# Patient Record
Sex: Female | Born: 1960 | Race: Black or African American | Hispanic: No | Smoking: Never smoker
Health system: Southern US, Community
[De-identification: ages and names within clinical notes are randomized; demographics above are authoritative.]

## PROBLEM LIST (undated history)

## (undated) DIAGNOSIS — E1142 Type 2 diabetes mellitus with diabetic polyneuropathy: Secondary | ICD-10-CM

## (undated) DIAGNOSIS — I4819 Other persistent atrial fibrillation: Secondary | ICD-10-CM

## (undated) DIAGNOSIS — F419 Anxiety disorder, unspecified: Secondary | ICD-10-CM

## (undated) DIAGNOSIS — M5416 Radiculopathy, lumbar region: Secondary | ICD-10-CM

## (undated) DIAGNOSIS — L729 Follicular cyst of the skin and subcutaneous tissue, unspecified: Secondary | ICD-10-CM

## (undated) DIAGNOSIS — I2510 Atherosclerotic heart disease of native coronary artery without angina pectoris: Secondary | ICD-10-CM

## (undated) DIAGNOSIS — G4733 Obstructive sleep apnea (adult) (pediatric): Secondary | ICD-10-CM

## (undated) DIAGNOSIS — F329 Major depressive disorder, single episode, unspecified: Secondary | ICD-10-CM

## (undated) DIAGNOSIS — I119 Hypertensive heart disease without heart failure: Secondary | ICD-10-CM

## (undated) DIAGNOSIS — Z789 Other specified health status: Secondary | ICD-10-CM

## (undated) DIAGNOSIS — E7800 Pure hypercholesterolemia, unspecified: Secondary | ICD-10-CM

## (undated) DIAGNOSIS — G5601 Carpal tunnel syndrome, right upper limb: Secondary | ICD-10-CM

## (undated) DIAGNOSIS — E669 Obesity, unspecified: Secondary | ICD-10-CM

## (undated) DIAGNOSIS — H3320 Serous retinal detachment, unspecified eye: Secondary | ICD-10-CM

## (undated) DIAGNOSIS — G4700 Insomnia, unspecified: Secondary | ICD-10-CM

## (undated) HISTORY — DX: Pure hypercholesterolemia, unspecified: E78.00

## (undated) HISTORY — DX: Carpal tunnel syndrome, right upper limb: G56.01

## (undated) HISTORY — PX: OTHER SURGICAL HISTORY: SHX169

## (undated) HISTORY — DX: Radiculopathy, lumbar region: M54.16

## (undated) HISTORY — DX: Follicular cyst of the skin and subcutaneous tissue, unspecified: L72.9

## (undated) HISTORY — DX: Atherosclerotic heart disease of native coronary artery without angina pectoris: I25.10

## (undated) HISTORY — DX: Obstructive sleep apnea (adult) (pediatric): G47.33

## (undated) HISTORY — DX: Other persistent atrial fibrillation: I48.19

## (undated) HISTORY — DX: Serous retinal detachment, unspecified eye: H33.20

## (undated) HISTORY — DX: Insomnia, unspecified: G47.00

## (undated) HISTORY — DX: Anxiety disorder, unspecified: F41.9

## (undated) HISTORY — DX: Other specified health status: Z78.9

---

## 2014-07-27 ENCOUNTER — Inpatient Hospital Stay (HOSPITAL_COMMUNITY)
Admission: AD | Admit: 2014-07-27 | Discharge: 2014-07-30 | DRG: 287 | Disposition: A | Discharge status: Home / Self Care | Payer: Medicaid Other | Source: Other Acute Inpatient Hospital | Attending: Cardiology | Admitting: Cardiology

## 2014-07-27 ENCOUNTER — Encounter (HOSPITAL_COMMUNITY): Payer: Self-pay | Admitting: Cardiology

## 2014-07-27 DIAGNOSIS — F32A Depression, unspecified: Secondary | ICD-10-CM

## 2014-07-27 DIAGNOSIS — F329 Major depressive disorder, single episode, unspecified: Secondary | ICD-10-CM | POA: Diagnosis present

## 2014-07-27 DIAGNOSIS — Z888 Allergy status to other drugs, medicaments and biological substances status: Secondary | ICD-10-CM

## 2014-07-27 DIAGNOSIS — E1169 Type 2 diabetes mellitus with other specified complication: Secondary | ICD-10-CM | POA: Diagnosis present

## 2014-07-27 DIAGNOSIS — R778 Other specified abnormalities of plasma proteins: Secondary | ICD-10-CM | POA: Diagnosis present

## 2014-07-27 DIAGNOSIS — E669 Obesity, unspecified: Secondary | ICD-10-CM | POA: Diagnosis present

## 2014-07-27 DIAGNOSIS — Z809 Family history of malignant neoplasm, unspecified: Secondary | ICD-10-CM

## 2014-07-27 DIAGNOSIS — Z8249 Family history of ischemic heart disease and other diseases of the circulatory system: Secondary | ICD-10-CM

## 2014-07-27 DIAGNOSIS — Z6837 Body mass index (BMI) 37.0-37.9, adult: Secondary | ICD-10-CM

## 2014-07-27 DIAGNOSIS — Z9119 Patient's noncompliance with other medical treatment and regimen: Secondary | ICD-10-CM | POA: Diagnosis present

## 2014-07-27 DIAGNOSIS — E785 Hyperlipidemia, unspecified: Secondary | ICD-10-CM | POA: Diagnosis present

## 2014-07-27 DIAGNOSIS — I2510 Atherosclerotic heart disease of native coronary artery without angina pectoris: Secondary | ICD-10-CM | POA: Diagnosis present

## 2014-07-27 DIAGNOSIS — I119 Hypertensive heart disease without heart failure: Secondary | ICD-10-CM | POA: Diagnosis present

## 2014-07-27 DIAGNOSIS — R7989 Other specified abnormal findings of blood chemistry: Secondary | ICD-10-CM

## 2014-07-27 DIAGNOSIS — R079 Chest pain, unspecified: Secondary | ICD-10-CM | POA: Diagnosis present

## 2014-07-27 DIAGNOSIS — E1142 Type 2 diabetes mellitus with diabetic polyneuropathy: Secondary | ICD-10-CM

## 2014-07-27 DIAGNOSIS — Z880 Allergy status to penicillin: Secondary | ICD-10-CM | POA: Diagnosis not present

## 2014-07-27 DIAGNOSIS — I10 Essential (primary) hypertension: Secondary | ICD-10-CM | POA: Diagnosis present

## 2014-07-27 DIAGNOSIS — Z823 Family history of stroke: Secondary | ICD-10-CM

## 2014-07-27 HISTORY — DX: Type 2 diabetes mellitus with diabetic polyneuropathy: E11.42

## 2014-07-27 HISTORY — DX: Hypertensive heart disease without heart failure: I11.9

## 2014-07-27 HISTORY — DX: Depression, unspecified: F32.A

## 2014-07-27 HISTORY — DX: Obesity, unspecified: E66.9

## 2014-07-27 HISTORY — DX: Major depressive disorder, single episode, unspecified: F32.9

## 2014-07-27 LAB — MRSA PCR SCREENING: MRSA BY PCR: NEGATIVE

## 2014-07-27 LAB — TROPONIN I
TROPONIN I: 0.05 ng/mL — AB (ref <0.031)
Troponin I: 0.03 ng/mL (ref <0.031)

## 2014-07-27 LAB — GLUCOSE, CAPILLARY
GLUCOSE-CAPILLARY: 303 mg/dL — AB (ref 70–99)
Glucose-Capillary: 368 mg/dL — ABNORMAL HIGH (ref 70–99)

## 2014-07-27 LAB — HEMOGLOBIN A1C
Hgb A1c MFr Bld: 9.6 % — ABNORMAL HIGH (ref <5.7)
MEAN PLASMA GLUCOSE: 229 mg/dL — AB (ref <117)

## 2014-07-27 LAB — TSH: TSH: 0.997 u[IU]/mL (ref 0.350–4.500)

## 2014-07-27 LAB — MAGNESIUM: MAGNESIUM: 1.8 mg/dL (ref 1.5–2.5)

## 2014-07-27 LAB — T4, FREE: Free T4: 0.89 ng/dL (ref 0.80–1.80)

## 2014-07-27 MED ORDER — ENOXAPARIN SODIUM 60 MG/0.6ML ~~LOC~~ SOLN
50.0000 mg | SUBCUTANEOUS | Status: DC
  Administered 2014-07-28: 50 mg via SUBCUTANEOUS
  Filled 2014-07-27 (×3): qty 0.6

## 2014-07-27 MED ORDER — HYDROCODONE-ACETAMINOPHEN 5-325 MG PO TABS
1.0000 | ORAL_TABLET | Freq: Four times a day (QID) | ORAL | Status: DC | PRN
  Administered 2014-07-28 – 2014-07-29 (×3): 1 via ORAL
  Filled 2014-07-27 (×3): qty 1

## 2014-07-27 MED ORDER — AMLODIPINE BESYLATE 10 MG PO TABS
10.0000 mg | ORAL_TABLET | Freq: Every day | ORAL | Status: DC
  Administered 2014-07-27 – 2014-07-30 (×4): 10 mg via ORAL
  Filled 2014-07-27 (×4): qty 1

## 2014-07-27 MED ORDER — ZOLPIDEM TARTRATE 5 MG PO TABS: 5.0000 mg | ORAL_TABLET | Freq: Every evening | ORAL | Status: DC | PRN

## 2014-07-27 MED ORDER — NITROGLYCERIN 0.4 MG SL SUBL: 0.4000 mg | SUBLINGUAL_TABLET | SUBLINGUAL | Status: DC | PRN

## 2014-07-27 MED ORDER — LABETALOL HCL 300 MG PO TABS
300.0000 mg | ORAL_TABLET | Freq: Two times a day (BID) | ORAL | Status: DC
  Administered 2014-07-27 – 2014-07-30 (×7): 300 mg via ORAL
  Filled 2014-07-27 (×9): qty 1

## 2014-07-27 MED ORDER — INSULIN ASPART 100 UNIT/ML ~~LOC~~ SOLN
0.0000 [IU] | Freq: Three times a day (TID) | SUBCUTANEOUS | Status: DC
  Administered 2014-07-27: 11 [IU] via SUBCUTANEOUS
  Administered 2014-07-28: 8 [IU] via SUBCUTANEOUS
  Administered 2014-07-28: 11 [IU] via SUBCUTANEOUS
  Administered 2014-07-28 – 2014-07-30 (×3): 3 [IU] via SUBCUTANEOUS
  Administered 2014-07-30: 8 [IU] via SUBCUTANEOUS

## 2014-07-27 MED ORDER — SODIUM CHLORIDE 0.9 % IV SOLN: INTRAVENOUS | Status: DC

## 2014-07-27 MED ORDER — NITROGLYCERIN IN D5W 200-5 MCG/ML-% IV SOLN: 5.0000 ug/min | INTRAVENOUS | Status: DC

## 2014-07-27 MED ORDER — ONDANSETRON HCL 4 MG/2ML IJ SOLN: 4.0000 mg | Freq: Four times a day (QID) | INTRAMUSCULAR | Status: DC | PRN

## 2014-07-27 MED ORDER — ROSUVASTATIN CALCIUM 5 MG PO TABS
5.0000 mg | ORAL_TABLET | Freq: Every day | ORAL | Status: DC
  Administered 2014-07-28 – 2014-07-29 (×2): 5 mg via ORAL
  Filled 2014-07-27 (×5): qty 1

## 2014-07-27 MED ORDER — LORAZEPAM 1 MG PO TABS: 1.0000 mg | ORAL_TABLET | Freq: Two times a day (BID) | ORAL | Status: DC | PRN

## 2014-07-27 MED ORDER — INSULIN ASPART 100 UNIT/ML ~~LOC~~ SOLN
0.0000 [IU] | Freq: Every day | SUBCUTANEOUS | Status: DC
  Administered 2014-07-27: 5 [IU] via SUBCUTANEOUS
  Administered 2014-07-28: 3 [IU] via SUBCUTANEOUS

## 2014-07-27 MED ORDER — INFLUENZA VAC SPLIT QUAD 0.5 ML IM SUSY
0.5000 mL | PREFILLED_SYRINGE | INTRAMUSCULAR | Status: AC
  Administered 2014-07-30: 0.5 mL via INTRAMUSCULAR
  Filled 2014-07-27 (×2): qty 0.5

## 2014-07-27 MED ORDER — ACETAMINOPHEN 325 MG PO TABS: 650.0000 mg | ORAL_TABLET | ORAL | Status: DC | PRN

## 2014-07-27 MED ORDER — HYDRALAZINE HCL 25 MG PO TABS
25.0000 mg | ORAL_TABLET | Freq: Three times a day (TID) | ORAL | Status: DC
  Administered 2014-07-27 – 2014-07-29 (×8): 25 mg via ORAL
  Filled 2014-07-27 (×13): qty 1

## 2014-07-27 MED ORDER — ASPIRIN EC 81 MG PO TBEC
81.0000 mg | DELAYED_RELEASE_TABLET | Freq: Every day | ORAL | Status: DC
  Administered 2014-07-28: 81 mg via ORAL
  Filled 2014-07-27 (×2): qty 1

## 2014-07-27 NOTE — H&P (Signed)
Andrea Grant is an 53 y.o. female.    Primary Cardiologist: NEW PCP: Neale Burly, MD  Chief Complaint: chest pain  HPI: 53 year old female with hx of HTN and diabetes who presented to [place] with 1 week hx of lt scapula pain that became intense and radiated to her chart and into her Lt axila and Lt arm.  She thought that it is like a pulled muscle.  No associated SOB or nausea some diaphoresis.  When the pain began 1 week ago she thought it was muscle spasm and used heat and ICE though not much relief.  Occ aleve which helped some.  She had a nose bleed and realized her BP was elevated.  She has missed 3 days now of BP meds- she had run out.  Today with radiation she became worried it was her heart.  Once she rec'd IV NTG and IV morphine her pain is now gone.  BP on arrival to [facility].  212/116.  Troponin elevated at 0.06 with neg CKMB. EKG with no old one to compare SR non specific T wave abnormality.   LABS: protime 11.7 INR 0.8 wbc 12.1  hgb 13.4/hct 41.6, plt 284, glu 282, BUN 15 Cr 0.90 GFR > 60 Na 132. K+ 3.7 CPK 79, MB 1.9 Troponin 0.06   CXR no acute cardiopulmonary process CT of chest: no acute vascular process, no aortic dissection or acute cardiopulmonary process.        Past Medical History  Diagnosis Date  . HTN (hypertension) 07/27/2014  . DM2 (diabetes mellitus, type 2) 07/27/2014  . Depression, episodic 07/27/2014   Past Surgical History  Procedure Laterality Date  . No surgical hx      Family History  Problem Relation Age of Onset  . Cancer Mother   . Heart disease Mother   . CVA Father    Social History:  reports that she has never smoked. She has never used smokeless tobacco. She reports that she does not drink alcohol or use illicit drugs. 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   Allergies:  Allergies  Allergen Reactions  . Penicillins   .  Percocet [Oxycodone-Acetaminophen]     OUTPT MEDS: norvasc 10 mg daily Ativan 1 mg prn Labetolol 300 mg BID Hydralazine 25 mg TID glucovance 5/500 BID crestor 5 mg daily- has not been taking Norco 5.325 prn neck pain  Humolog 75/25 20 units [place] BID BK and dinner  ROS: General:no colds or fevers, no weight changes Skin:no rashes or ulcers HEENT:no blurred vision, no congestion, recent nose bleeds CV:see HPI PUL:see HPI GI:no diarrhea constipation or melena, no indigestion GU:no hematuria, no dysuria MS:no joint pain, no claudication Neuro:no syncope, no lightheadedness Endo:+ diabetes fairly well controlled, no thyroid disease GYN: LMP 1 month ago, denies preg- her MD stated she could not get pregnant.   Blood pressure 162/90, pulse 79, temperature 98.3 F (36.8 C), temperature source Oral, height 5\' 5"  (1.651 m), weight 225 lb 1.4 oz (102.1 kg), SpO2 100 %. PE: General: Obese pleasant black female in no acute distress Skin:Warm and dry, brisk capillary refill HEENT:normocephalic, sclera clear, mucus membranes moist Neck:supple, no JVD, no bruits, no adenopathy  Heart:S1S2 RRR without murmur, gallup, rub or click Lungs:clear without rales, rhonchi, or wheezes HUT:MLYYT, soft, non tender, + BS, do not palpate liver spleen or  masses Ext:no lower ext edema, 2+ pedal pulses, 2+ radial pulses, back mild reproducible lt scapula pain.  Neuro:alert and oriented X 3, MAE, follows commands, + facial symmetry   Assessment/Plan  1.  Chest pain with some atypical features with trivial elevation of troponin. 2.  Hypertensive heart disease. 3.  Accelerated hypertension. 4.  Minimal elevation of troponin likely due to demand ischemia in the setting of accelerated hypertension. 5.  Diabetes mellitus with peripheral neuropathy by history. 6.  Obesity with need to lose weight. 7.  History of depression. 8.  Recent epistaxis. 9.  Medical noncompliance  Recommendations:  No heparin  because of nosebleeds.  Serial troponin and EKG.  Once blood pressure is controlled, may consider [place] scan Myoview for risk assessment suspect this is mostly due to uncontrolled hypertension.  Kerry Hough. MD [facility] 07/27/2014 1:24 PM

## 2014-07-28 DIAGNOSIS — I517 Cardiomegaly: Secondary | ICD-10-CM

## 2014-07-28 LAB — GLUCOSE, CAPILLARY
GLUCOSE-CAPILLARY: 179 mg/dL — AB (ref 70–99)
Glucose-Capillary: 273 mg/dL — ABNORMAL HIGH (ref 70–99)
Glucose-Capillary: 301 mg/dL — ABNORMAL HIGH (ref 70–99)

## 2014-07-28 LAB — BASIC METABOLIC PANEL
Anion gap: 9 (ref 5–15)
BUN: 20 mg/dL (ref 6–23)
CALCIUM: 8.8 mg/dL (ref 8.4–10.5)
CO2: 23 mmol/L (ref 19–32)
CREATININE: 1.06 mg/dL (ref 0.50–1.10)
Chloride: 100 mEq/L (ref 96–112)
GFR, EST AFRICAN AMERICAN: 69 mL/min — AB (ref >90)
GFR, EST NON AFRICAN AMERICAN: 59 mL/min — AB (ref >90)
GLUCOSE: 343 mg/dL — AB (ref 70–99)
Potassium: 3.9 mmol/L (ref 3.5–5.1)
Sodium: 132 mmol/L — ABNORMAL LOW (ref 135–145)

## 2014-07-28 LAB — LIPID PANEL
CHOL/HDL RATIO: 7.2 ratio
CHOLESTEROL: 258 mg/dL — AB (ref 0–200)
HDL: 36 mg/dL — ABNORMAL LOW (ref >39)
LDL Cholesterol: 185 mg/dL — ABNORMAL HIGH (ref 0–99)
Triglycerides: 185 mg/dL — ABNORMAL HIGH (ref <150)
VLDL: 37 mg/dL (ref 0–40)

## 2014-07-28 LAB — CBC
HCT: 35.9 % — ABNORMAL LOW (ref 36.0–46.0)
Hemoglobin: 11.9 g/dL — ABNORMAL LOW (ref 12.0–15.0)
MCH: 29.9 pg (ref 26.0–34.0)
MCHC: 33.1 g/dL (ref 30.0–36.0)
MCV: 90.2 fL (ref 78.0–100.0)
PLATELETS: 270 10*3/uL (ref 150–400)
RBC: 3.98 MIL/uL (ref 3.87–5.11)
RDW: 13.5 % (ref 11.5–15.5)
WBC: 9.2 10*3/uL (ref 4.0–10.5)

## 2014-07-28 LAB — TROPONIN I: TROPONIN I: 0.05 ng/mL — AB (ref <0.031)

## 2014-07-28 MED ORDER — METFORMIN HCL 500 MG PO TABS
500.0000 mg | ORAL_TABLET | Freq: Two times a day (BID) | ORAL | Status: DC
  Administered 2014-07-28: 500 mg via ORAL
  Filled 2014-07-28 (×4): qty 1

## 2014-07-28 MED ORDER — INSULIN DETEMIR 100 UNIT/ML FLEXPEN: 30.0000 [IU] | PEN_INJECTOR | Freq: Two times a day (BID) | SUBCUTANEOUS | Status: DC

## 2014-07-28 MED ORDER — INSULIN DETEMIR 100 UNIT/ML ~~LOC~~ SOLN
30.0000 [IU] | Freq: Two times a day (BID) | SUBCUTANEOUS | Status: DC
  Administered 2014-07-28 – 2014-07-29 (×2): 30 [IU] via SUBCUTANEOUS
  Administered 2014-07-29: 15 [IU] via SUBCUTANEOUS
  Administered 2014-07-30: 30 [IU] via SUBCUTANEOUS
  Filled 2014-07-28 (×6): qty 0.3

## 2014-07-28 MED FILL — Nitroglycerin IV Soln 200 MCG/ML in D5W: INTRAVENOUS | Qty: 250 | Status: AC

## 2014-07-28 NOTE — Progress Notes (Signed)
Subjective:  Intermittent sharp pains lasting seconds.  Feeling better, not SOB  Objective:  Vital Signs in the last 24 hours: BP 168/74 mmHg  Pulse 89  Temp(Src) 98.2 F (36.8 C) (Oral)  Resp 19  Ht 5\' 5"  (1.651 m)  Wt 102.967 kg (227 lb)  BMI 37.77 kg/m2  SpO2 99%  LMP  (LMP Unknown)  Physical Exam: Obese BF in NAD Lungs:  Clear Cardiac:  Regular rhythm, normal S1 and S2, no S3 Extremities:  No edema present  Intake/Output from previous day: 12/27 0701 - 12/28 0700 In: 480 [P.O.:480] Out: -   Weight Filed Weights   07/27/14 1100 07/28/14 0500  Weight: 102.1 kg (225 lb 1.4 oz) 102.967 kg (227 lb)    Lab Results: Basic Metabolic Panel:  Recent Labs  07/28/14 0045  NA 132*  K 3.9  CL 100  CO2 23  GLUCOSE 343*  BUN 20  CREATININE 1.06   CBC:  Recent Labs  07/28/14 0045  WBC 9.2  HGB 11.9*  HCT 35.9*  MCV 90.2  PLT 270   Cardiac Enzymes:  Recent Labs  07/27/14 1503 07/27/14 1924 07/28/14 0045  TROPONINI 0.05* 0.03 0.05*    Telemetry: Sinus rhythm  Assessment/Plan:  1. Accelerated hypertension better controlled now 2. Hypertensive heart disease 3. Poorly controlled diabetes 4. Trivial troponin elevations likely related to accelerated hypertension  Rec:  Move to floor and ambulate.  Check ECHO.  Stress tomorrow.      Kerry Hough  MD [facility] Cardiology  07/28/2014, 8:46 AM

## 2014-07-28 NOTE — Progress Notes (Signed)
Inpatient Diabetes Program Recommendations  AACE/ADA: New Consensus Statement on Inpatient Glycemic Control (2013)  Target Ranges:  Prepandial:   less than 140 mg/dL      Peak postprandial:   less than 180 mg/dL (1-2 hours)      Critically ill patients:  140 - 180 mg/dL   Inpatient Diabetes Program Recommendations Insulin - Basal: Start Levemir 30 units daily  Pt is listed as having Medicaid [place].  Either Lantus or Levemir are preferred.  Both are available in the pen and vial.   Thank you  Raoul Pitch BSN, RN,CDE Inpatient Diabetes Coordinator [phone] (team pager)

## 2014-07-28 NOTE — Progress Notes (Signed)
  Echocardiogram 2D Echocardiogram has been performed.  Tashana Haberl FRANCES 07/28/2014, 12:23 PM

## 2014-07-29 ENCOUNTER — Encounter (HOSPITAL_COMMUNITY)
Admission: AD | Disposition: A | Discharge status: Home / Self Care | Payer: Self-pay | Source: Other Acute Inpatient Hospital | Attending: Cardiology

## 2014-07-29 ENCOUNTER — Encounter (HOSPITAL_COMMUNITY): Payer: Self-pay | Admitting: Interventional Cardiology

## 2014-07-29 DIAGNOSIS — I2510 Atherosclerotic heart disease of native coronary artery without angina pectoris: Secondary | ICD-10-CM

## 2014-07-29 HISTORY — PX: LEFT HEART CATHETERIZATION WITH CORONARY ANGIOGRAM: SHX5451

## 2014-07-29 LAB — CBC
HEMATOCRIT: 38.2 % (ref 36.0–46.0)
Hemoglobin: 12.4 g/dL (ref 12.0–15.0)
MCH: 29.6 pg (ref 26.0–34.0)
MCHC: 32.5 g/dL (ref 30.0–36.0)
MCV: 91.2 fL (ref 78.0–100.0)
Platelets: 296 10*3/uL (ref 150–400)
RBC: 4.19 MIL/uL (ref 3.87–5.11)
RDW: 13.7 % (ref 11.5–15.5)
WBC: 8.4 10*3/uL (ref 4.0–10.5)

## 2014-07-29 LAB — CREATININE, SERUM
Creatinine, Ser: 0.73 mg/dL (ref 0.50–1.10)
GFR calc Af Amer: >90 mL/min (ref >90)
GFR calc non Af Amer: >90 mL/min (ref >90)

## 2014-07-29 LAB — GLUCOSE, CAPILLARY
GLUCOSE-CAPILLARY: 191 mg/dL — AB (ref 70–99)
GLUCOSE-CAPILLARY: 142 mg/dL — AB (ref 70–99)
GLUCOSE-CAPILLARY: 117 mg/dL — AB (ref 70–99)
Glucose-Capillary: 258 mg/dL — ABNORMAL HIGH (ref 70–99)

## 2014-07-29 LAB — PROTIME-INR
INR: 1.03 (ref 0.00–1.49)
PROTHROMBIN TIME: 13.6 s (ref 11.6–15.2)

## 2014-07-29 LAB — PREGNANCY, URINE: PREG TEST UR: NEGATIVE

## 2014-07-29 SURGERY — LEFT HEART CATHETERIZATION WITH CORONARY ANGIOGRAM
Anesthesia: LOCAL

## 2014-07-29 MED ORDER — SODIUM CHLORIDE 0.9 % IV SOLN
INTRAVENOUS | Status: AC
  Administered 2014-07-29: 17:00:00 via INTRAVENOUS

## 2014-07-29 MED ORDER — VERAPAMIL HCL 2.5 MG/ML IV SOLN
INTRAVENOUS | Status: AC
  Filled 2014-07-29: qty 2

## 2014-07-29 MED ORDER — ASPIRIN 81 MG PO CHEW
81.0000 mg | CHEWABLE_TABLET | ORAL | Status: AC
  Administered 2014-07-29: 81 mg via ORAL
  Filled 2014-07-29: qty 1

## 2014-07-29 MED ORDER — ACETAMINOPHEN 325 MG PO TABS
650.0000 mg | ORAL_TABLET | ORAL | Status: DC | PRN
  Administered 2014-07-29 – 2014-07-30 (×2): 650 mg via ORAL
  Filled 2014-07-29 (×2): qty 2

## 2014-07-29 MED ORDER — FENTANYL CITRATE 0.05 MG/ML IJ SOLN
INTRAMUSCULAR | Status: AC
  Filled 2014-07-29: qty 2

## 2014-07-29 MED ORDER — ASPIRIN EC 81 MG PO TBEC: 81.0000 mg | DELAYED_RELEASE_TABLET | Freq: Every day | ORAL | Status: DC

## 2014-07-29 MED ORDER — SODIUM CHLORIDE 0.9 % IJ SOLN
3.0000 mL | Freq: Two times a day (BID) | INTRAMUSCULAR | Status: DC
  Administered 2014-07-29: 3 mL via INTRAVENOUS

## 2014-07-29 MED ORDER — MIDAZOLAM HCL 2 MG/2ML IJ SOLN
INTRAMUSCULAR | Status: AC
Start: 2014-07-29 — End: 2014-07-29
  Filled 2014-07-29: qty 2

## 2014-07-29 MED ORDER — SODIUM CHLORIDE 0.9 % IV SOLN: 1.0000 mL/kg/h | INTRAVENOUS | Status: DC

## 2014-07-29 MED ORDER — REGADENOSON 0.4 MG/5ML IV SOLN: 0.4000 mg | Freq: Once | INTRAVENOUS | Status: DC

## 2014-07-29 MED ORDER — NITROGLYCERIN 1 MG/10 ML FOR IR/CATH LAB
INTRA_ARTERIAL | Status: AC
  Filled 2014-07-29: qty 10

## 2014-07-29 MED ORDER — SODIUM CHLORIDE 0.9 % IV SOLN: 250.0000 mL | INTRAVENOUS | Status: DC | PRN

## 2014-07-29 MED ORDER — LIDOCAINE HCL (PF) 1 % IJ SOLN
INTRAMUSCULAR | Status: AC
  Filled 2014-07-29: qty 30

## 2014-07-29 MED ORDER — LABETALOL HCL 5 MG/ML IV SOLN
INTRAVENOUS | Status: AC
Start: 2014-07-29 — End: 2014-07-29
  Filled 2014-07-29: qty 4

## 2014-07-29 MED ORDER — ENOXAPARIN SODIUM 40 MG/0.4ML ~~LOC~~ SOLN
40.0000 mg | SUBCUTANEOUS | Status: DC
  Filled 2014-07-29 (×2): qty 0.4

## 2014-07-29 MED ORDER — ASPIRIN 81 MG PO CHEW: 81.0000 mg | CHEWABLE_TABLET | Freq: Every day | ORAL | Status: DC

## 2014-07-29 MED ORDER — HEPARIN (PORCINE) IN NACL 2-0.9 UNIT/ML-% IJ SOLN
INTRAMUSCULAR | Status: AC
  Filled 2014-07-29: qty 1500

## 2014-07-29 MED ORDER — HEPARIN SODIUM (PORCINE) 1000 UNIT/ML IJ SOLN
INTRAMUSCULAR | Status: AC
  Filled 2014-07-29: qty 1

## 2014-07-29 MED ORDER — SODIUM CHLORIDE 0.9 % IJ SOLN: 3.0000 mL | INTRAMUSCULAR | Status: DC | PRN

## 2014-07-29 NOTE — H&P (View-Only) (Signed)
Subjective:  Was planning to do stress test this morning but had recurrent chest discomfort overnight.  The pain has some atypical features to it, described as left-sided, radiating to shoulder and shoulder blade comes and goes.  EKG baseline is abnormal.  Blood pressure is better controlled.  Echocardiogram showed moderate to severe concentric LVH consistent with her previous uncontrolled hypertension.  Objective:  Vital Signs in the last 24 hours: BP 157/79 mmHg  Pulse 87  Temp(Src) 98.3 F (36.8 C) (Oral)  Resp 18  Ht 5\' 5"  (1.651 m)  Wt 102.785 kg (226 lb 9.6 oz)  BMI 37.71 kg/m2  SpO2 98%  LMP  (LMP Unknown)  Physical Exam: Obese BF in NAD Lungs:  Clear Cardiac:  Regular rhythm, normal S1 and S2, no S3 Extremities:  No edema present  Intake/Output from previous day: 12/28 0701 - 12/29 0700 In: 240 [P.O.:240] Out: -   Weight Filed Weights   07/27/14 1100 07/28/14 0500 07/29/14 0500  Weight: 102.1 kg (225 lb 1.4 oz) 102.967 kg (227 lb) 102.785 kg (226 lb 9.6 oz)    Lab Results: Basic Metabolic Panel:  Recent Labs  07/28/14 0045  NA 132*  K 3.9  CL 100  CO2 23  GLUCOSE 343*  BUN 20  CREATININE 1.06   CBC:  Recent Labs  07/28/14 0045  WBC 9.2  HGB 11.9*  HCT 35.9*  MCV 90.2  PLT 270   Cardiac Enzymes:  Recent Labs  07/27/14 1503 07/27/14 1924 07/28/14 0045  TROPONINI 0.05* 0.03 0.05*    Telemetry: Sinus rhythm  Assessment/Plan:  1. Accelerated hypertension better controlled now 2. Hypertensive heart disease 3. Poorly controlled diabetes 4.  Recurrent chest discomfort with atypical features but with trivial troponin elevations that may have been due to accelerated hypertension but with baseline EKG feel at this point that a catheterization to better help management in the future, then a myocardial perfusion scan.  Rec:  We'll cancel nuclear test and plan cardiac catheterization today.  Because of her large body habitus.  We'll ask for  Dr. Tamala Julian do the procedure through the radial approach.Cardiac catheterization was discussed with the patient fully including risks of myocardial infarction, death, stroke, bleeding, arrhythmia, dye allergy, renal insufficiency or bleeding.  The patient understands and is willing to proceed.   Kerry Hough  MD [facility] Cardiology  07/29/2014, 8:52 AM

## 2014-07-29 NOTE — Progress Notes (Signed)
07/29/2014 8:15 AM  Nursing note Upon morning assessment, pt. C/o intermittent CP 2/10 "aching" mid chest through out the night starting under left shoulder blade and wrapping around left breast. Pt. Denies SOB. EKG obtained. No acute changes from prior EKG. VS stable. Pt states she did not want to take any SL NTG due to H/A after prior doses. Dr. Wynonia Lawman paged and made aware. No new orders at this time. Pt. Scheduled for Myoview this am. Will continue to closely monitor patient.  Naydeen Speirs, Arville Lime

## 2014-07-29 NOTE — CV Procedure (Signed)
     Left Heart Catheterization with Coronary Angiography  Report  Andrea Grant  53 y.o.  female 01/17/1961  Procedure Date: 07/29/2014 Referring Physician: Octavia Bruckner, M.D. Primary Cardiologist:: Same  INDICATIONS: Vague chest discomfort with normal elevation in troponin and a 53 year old diabetic was severely elevated blood pressure. Coronary angiography is being done as a direct approach to rule out significant coronary disease.  PROCEDURE: 1. Left heart catheterization; 2. Coronary angiography; 3. Left ventriculography  CONSENT:  The risks, benefits, and details of the procedure were explained in detail to the patient. Risks including death, stroke, heart attack, kidney injury, allergy, limb ischemia, bleeding and radiation injury were discussed.  The patient verbalized understanding and wanted to proceed.  Informed written consent was obtained.  PROCEDURE TECHNIQUE:  After Xylocaine anesthesia a 5 French Slender sheath was placed in the right radial artery with an angiocath and the modified Seldinger technique.  Coronary angiography was done using a 5 F JR4 catheter.  Left ventriculography was done using the JR 4 catheter and hand injection.   Hemostasis was achieved with a wrist band at 14 cc of air   CONTRAST:  Total of 75 cc.  COMPLICATIONS:  None    HEMODYNAMICS:  Aortic pressure 162/93 mmHg; LV pressure 163/23 mmHg; LVEDP 27 mmHg   ANGIOGRAPHIC DATA:   The left main coronary artery is normal.  The left anterior descending artery is large and wraps around the left ventricular apex. There is mid vessel eccentric 30% narrowing and distal vessel eccentric 60-70% narrowing. The large first diagonal contains focal mid vessel 50% narrowing..  The left circumflex artery is patent. 2 marginal branches arise from the circumflex. He can marginal is a dominant of the 2 vessels. The proximal obtuse marginal contains a 50% narrowing..  The right coronary artery is  dominant and  widely patent.   LEFT VENTRICULOGRAM:  Left ventricular angiogram was done in the 30 RAO projection and revealed brisk symmetric contractility with EF 60%. Elevated end-diastolic pressures noted.    IMPRESSIONS:  1. Nonobstructive coronary artery disease involving the left anterior descending, the first diagonal, and the first obtuse marginal branch.  2. Normal left ventricular systolic function with EF of at least 60%, left ventricular hypertrophy, and evidence of diastolic heart failure (LVEDP greater than 23 mmHg )  RECOMMENDATION:    1. Aggressive management of hypertension  2. Eligible for discharge when blood pressure is controlled.

## 2014-07-29 NOTE — Progress Notes (Signed)
Subjective:  Was planning to do stress test this morning but had recurrent chest discomfort overnight.  The pain has some atypical features to it, described as left-sided, radiating to shoulder and shoulder blade comes and goes.  EKG baseline is abnormal.  Blood pressure is better controlled.  Echocardiogram showed moderate to severe concentric LVH consistent with her previous uncontrolled hypertension.  Objective:  Vital Signs in the last 24 hours: BP 157/79 mmHg  Pulse 87  Temp(Src) 98.3 F (36.8 C) (Oral)  Resp 18  Ht 5\' 5"  (1.651 m)  Wt 102.785 kg (226 lb 9.6 oz)  BMI 37.71 kg/m2  SpO2 98%  LMP  (LMP Unknown)  Physical Exam: Obese BF in NAD Lungs:  Clear Cardiac:  Regular rhythm, normal S1 and S2, no S3 Extremities:  No edema present  Intake/Output from previous day: 12/28 0701 - 12/29 0700 In: 240 [P.O.:240] Out: -   Weight Filed Weights   07/27/14 1100 07/28/14 0500 07/29/14 0500  Weight: 102.1 kg (225 lb 1.4 oz) 102.967 kg (227 lb) 102.785 kg (226 lb 9.6 oz)    Lab Results: Basic Metabolic Panel:  Recent Labs  07/28/14 0045  NA 132*  K 3.9  CL 100  CO2 23  GLUCOSE 343*  BUN 20  CREATININE 1.06   CBC:  Recent Labs  07/28/14 0045  WBC 9.2  HGB 11.9*  HCT 35.9*  MCV 90.2  PLT 270   Cardiac Enzymes:  Recent Labs  07/27/14 1503 07/27/14 1924 07/28/14 0045  TROPONINI 0.05* 0.03 0.05*    Telemetry: Sinus rhythm  Assessment/Plan:  1. Accelerated hypertension better controlled now 2. Hypertensive heart disease 3. Poorly controlled diabetes 4.  Recurrent chest discomfort with atypical features but with trivial troponin elevations that may have been due to accelerated hypertension but with baseline EKG feel at this point that a catheterization to better help management in the future, then a myocardial perfusion scan.  Rec:  We'll cancel nuclear test and plan cardiac catheterization today.  Because of her large body habitus.  We'll ask for  Dr. Tamala Julian do the procedure through the radial approach.Cardiac catheterization was discussed with the patient fully including risks of myocardial infarction, death, stroke, bleeding, arrhythmia, dye allergy, renal insufficiency or bleeding.  The patient understands and is willing to proceed.   Kerry Hough  MD [facility] Cardiology  07/29/2014, 8:52 AM

## 2014-07-29 NOTE — Interval H&P Note (Signed)
Cath Lab Visit (complete for each Cath Lab visit)  Clinical Evaluation Leading to the Procedure:   ACS: Yes.    Non-ACS:    Anginal Classification: CCS III  Anti-ischemic medical therapy: Minimal Therapy (1 class of medications)  Non-Invasive Test Results: No non-invasive testing performed  Prior CABG: No previous CABG      History and Physical Interval Note:  07/29/2014 3:44 PM  Andrea Grant  has presented today for surgery, with the diagnosis of cp  The various methods of treatment have been discussed with the patient and family. After consideration of risks, benefits and other options for treatment, the patient has consented to  Procedure(s): LEFT HEART CATHETERIZATION WITH CORONARY ANGIOGRAM (N/A) as a surgical intervention .  The patient's history has been reviewed, patient examined, no change in status, stable for surgery.  I have reviewed the patient's chart and labs.  Questions were answered to the patient's satisfaction.     Sinclair Grooms

## 2014-07-29 NOTE — Progress Notes (Signed)
Inpatient Diabetes Program Recommendations  AACE/ADA: New Consensus Statement on Inpatient Glycemic Control (2013)  Target Ranges:  Prepandial:   less than 140 mg/dL      Peak postprandial:   less than 180 mg/dL (1-2 hours)      Critically ill patients:  140 - 180 mg/dL   Reason for Visit: elevated CBG  Diabetes history: Type 2 Outpatient Diabetes medications: ordered Glucovance 5/500mg  bid, Humalog 75/25 20 units q day , taking Levemir 30 units bid, Metformin 500mg  2 tabs daily Current orders for Inpatient glycemic control: Levemir 30 units daily, Novolog 0-15 units with meals, and Novolog 0-5 units at hs.   Please consider increasing Levemir to 35 units (fasting CBG 191mg /dl) and adding Novolog mealtime insulin 3 units tid with meals- continue Novolog correction as ordered.    Gentry Fitz, RN, BA, MHA, CDE Diabetes Coordinator Inpatient Diabetes Program  [phone] (Team Pager) [phone] [facility]) 07/29/2014 8:00 AM

## 2014-07-29 NOTE — Progress Notes (Signed)
07/29/2014 10:37 AM Pt. Viewed video #115. Questions and concerns addressed. R groin prepped. IVF started pre-cath per orders. Graycen Sadlon, Arville Lime

## 2014-07-30 LAB — GLUCOSE, CAPILLARY
GLUCOSE-CAPILLARY: 182 mg/dL — AB (ref 70–99)
Glucose-Capillary: 198 mg/dL — ABNORMAL HIGH (ref 70–99)
Glucose-Capillary: 280 mg/dL — ABNORMAL HIGH (ref 70–99)

## 2014-07-30 MED ORDER — HYDRALAZINE HCL 50 MG PO TABS: 50.0000 mg | ORAL_TABLET | Freq: Two times a day (BID) | ORAL | Status: DC

## 2014-07-30 MED ORDER — SPIRONOLACTONE 25 MG PO TABS: 25.0000 mg | ORAL_TABLET | Freq: Every day | ORAL | Status: DC

## 2014-07-30 MED ORDER — LABETALOL HCL 300 MG PO TABS: 300.0000 mg | ORAL_TABLET | Freq: Two times a day (BID) | ORAL | Status: DC

## 2014-07-30 MED ORDER — SPIRONOLACTONE 25 MG PO TABS
25.0000 mg | ORAL_TABLET | Freq: Every day | ORAL | Status: DC
  Administered 2014-07-30: 25 mg via ORAL
  Filled 2014-07-30: qty 1

## 2014-07-30 MED ORDER — NITROGLYCERIN 0.4 MG SL SUBL: 0.4000 mg | SUBLINGUAL_TABLET | SUBLINGUAL | Status: DC | PRN

## 2014-07-30 MED ORDER — ATORVASTATIN CALCIUM 40 MG PO TABS
40.0000 mg | ORAL_TABLET | Freq: Every day | ORAL | Status: DC
  Filled 2014-07-30: qty 1

## 2014-07-30 MED ORDER — FUROSEMIDE 40 MG PO TABS: 40.0000 mg | ORAL_TABLET | Freq: Every day | ORAL | Status: DC

## 2014-07-30 MED ORDER — AMLODIPINE BESYLATE 10 MG PO TABS: 10.0000 mg | ORAL_TABLET | Freq: Every day | ORAL | Status: AC

## 2014-07-30 MED ORDER — ROSUVASTATIN CALCIUM 5 MG PO TABS: 5.0000 mg | ORAL_TABLET | Freq: Every day | ORAL | Status: DC

## 2014-07-30 MED ORDER — FUROSEMIDE 40 MG PO TABS
40.0000 mg | ORAL_TABLET | Freq: Every day | ORAL | Status: DC
  Administered 2014-07-30: 40 mg via ORAL
  Filled 2014-07-30: qty 1

## 2014-07-30 MED ORDER — ATORVASTATIN CALCIUM 40 MG PO TABS: 40.0000 mg | ORAL_TABLET | Freq: Every day | ORAL | Status: DC

## 2014-07-30 MED ORDER — HYDRALAZINE HCL 50 MG PO TABS
50.0000 mg | ORAL_TABLET | Freq: Two times a day (BID) | ORAL | Status: DC
  Administered 2014-07-30: 50 mg via ORAL
  Filled 2014-07-30 (×3): qty 1

## 2014-07-30 MED ORDER — ROSUVASTATIN CALCIUM 5 MG PO TABS
5.0000 mg | ORAL_TABLET | Freq: Every day | ORAL | Status: DC
  Filled 2014-07-30: qty 1

## 2014-07-30 NOTE — Progress Notes (Signed)
CARE MANAGEMENT NOTE 07/30/2014  Patient:  Andrea Grant, Andrea Grant   Account Number:  [id number]  Date Initiated:  07/28/2014  Documentation initiated by:  Elissa Hefty  Subjective/Objective Assessment:   adm w ch pain, htn     Action/Plan:   lives w fam, pcp dr Bennie [place]   Anticipated DC Date:  07/30/2014   Anticipated DC Plan:  [place]  CM consult      Choice offered to / List presented to:             Status of service:  Completed, signed off Medicare Important Message given?   (If response is "NO", the following Medicare IM given date fields will be blank) Date Medicare IM given:   Medicare IM given by:   Date Additional Medicare IM given:   Additional Medicare IM given by:    Discharge Disposition:  HOME/SELF CARE  Per UR Regulation:  Reviewed for med. necessity/level of care/duration of stay  If discussed at [place] of Stay Meetings, dates discussed:    Comments:  07/30/14- 1200- Marvetta Gibbons RN, BSN [phone] Pt seen for medication compliance- per conversation with pt - pt states that she does not have Medicaid- calll made to [facility]- message left to call pt- call made to [facility] in [place] who confirmed that no insurance was on file for pt- pt pays out of pocket for meds- she last picked up meds on 12/26 which was one of her bp meds- most of discharge meds on $4 list- coupon given to pt for crestor- pt is also on [place] which she has a full pen of- she reports that this is too expensive for her - she states that she has used 70/30  pen in past- patient assistance forms given to pt to take to her PCP in f/u on which insulin would be best- she understands that if she stays on pens she needs to seek assistance from drug company for this. She reports that she is trying to get insurance through her company but has not heard from them regarding this. Advised her to f/u with her PCP as soon as possible. 1600- call returned from [facility]- per conversation with  Suanne Marker- pt's Medicaid has been verified and shows that she has active full Medicaid and should have no problem getting her medications under medicaid- not sure why pt does not report that she has Medicaid.

## 2014-07-30 NOTE — Discharge Summary (Addendum)
Physician Discharge Summary  Patient ID: Andrea Grant MRN: [id number] DOB/AGE: 11/30/1960 53 y.o.  Admit date: 07/27/2014 Discharge date: 07/30/2014  Primary Physician:  Dr. Sherrie Sport  Primary Discharge Diagnosis:  1.  Accelerated hypertension with chest pain  Secondary Discharge Diagnosis: 2.  Chest pain with atypical features with trivial troponin elevations likely due to accelerated hypertension. 3.  Coronary artery disease, moderate. 4.  Poorly controlled type 2 diabetes mellitus. 5.  Medical noncompliance. 6.  Obesity. 7.  Untreated hyperlipidemia  Procedures:  2-D echocardiogram Cardiac catheterization done through the radial approach  Hospital Course: This 53 year old black female was transferred from [facility] for evaluation of chest pain and trivial troponin elevations.  She has a long-standing history of hypertension, obesity and poorly controlled diabetes.  She has not taking her medicines in several days and presented with accelerated hypertension.  2.  [facility] complaining of chest discomfort.  A CT angiogram did not show any evidence of dissection or pulmonary embolus.  She came to have chest pain and had trivial troponin elevations and was transferred to [facility] for further evaluation.  The patient states that she has difficulty affording to see her regular doctor and that he will not give her samples of medication.  The patient was transferred and had continued elevations of blood pressure.  She was started back on her blood pressure medications and additional blood pressure medicines were ordered.  She continued to complain of some chest pain that had atypical features and because of the abnormal EKG, trivial troponin elevations.  It was recommended that catheterization be done to further assess whether she had critical coronary artery disease.  This was done the day prior to discharge by Dr. Tamala Julian with findings of a hypertrophic left ventricle with  good systolic function, increased LVEDP, moderate 60% distal LAD stenosis and moderate first OM stenosis, right coronary artery did not have significant disease.  Ejection fraction was 60%.  Her echocardiogram showed moderate to severe concentric LVH with grade 1 diastolic dysfunction.  It was recommended that she have aggressive management of her blood pressure and her diabetes.  She was seen by the case manager to help with medication prior to discharge.  Blood pressure was still mildly elevated, but it was felt that she could be discharged and we will be adding diuretic therapy in the form of furosemide as well as spironolactone because of the increased LVEDP.  Her hydralazine will be increased and she will resume her labetalol.  She should follow-up with Dr. Sherrie Sport within the next week.  The importance of medical compliance was discussed with her as well as need for weight loss.  Discharge Exam: Blood pressure 177/79, pulse 87, temperature 98.2 F (36.8 C), temperature source Oral, resp. rate 18, height 5\' 5"  (1.651 m), weight 103.828 kg (228 lb 14.4 oz), SpO2 98 %. Weight: 103.828 kg (228 lb 14.4 oz) Radial catheterization site is healed and clean and dry, lungs clear, no S3  Labs: CBC:   Lab Results  Component Value Date   WBC 8.4 07/29/2014   HGB 12.4 07/29/2014   HCT 38.2 07/29/2014   MCV 91.2 07/29/2014   PLT 296 07/29/2014    CMP:  Recent Labs Lab 07/28/14 0045 07/29/14 1847  NA 132*  --   K 3.9  --   CL 100  --   CO2 23  --   BUN 20  --   CREATININE 1.06 0.73  CALCIUM 8.8  --   GLUCOSE 343*  --  Lipid Panel     Component Value Date/Time   CHOL 258* 07/28/2014 0045   TRIG 185* 07/28/2014 0045   HDL 36* 07/28/2014 0045   CHOLHDL 7.2 07/28/2014 0045   VLDL 37 07/28/2014 0045   LDLCALC 185* 07/28/2014 0045   Cardiac Enzymes:  Recent Labs  07/27/14 1503 07/27/14 1924 07/28/14 0045  TROPONINI 0.05* 0.03 0.05*   Thyroid: Lab Results  Component Value  Date   TSH 0.997 07/27/2014   Hemoglobin A1C: Lab Results  Component Value Date   HGBA1C 9.6* 07/27/2014    Radiology: CT scan evidently done at [facility] did not show evidence of a dissection.  Mild cardiomegaly noted.  EKG: Voltage for LVH, lateral T-wave inversions consistent with LVH or ischemia, sinus rhythm  Discharge Medications:   Medication List    STOP taking these medications        RED YEAST RICE PO      TAKE these medications        amLODipine 10 MG tablet  Commonly known as:  NORVASC  Take 10 mg by mouth daily.     amLODipine 10 MG tablet  Commonly known as:  NORVASC  Take 1 tablet (10 mg total) by mouth daily.     aspirin EC 81 MG tablet  Take 81 mg by mouth daily.     Rosuvastatin 5 MG tablet  Commonly known as:  CRESTOR  Take 1 tablet (5 mg total) by mouth daily      calcium carbonate 600 MG Tabs tablet  Commonly known as:  OS-CAL  Take 600 mg by mouth daily.     cholecalciferol 1000 UNITS tablet  Commonly known as:  VITAMIN D  Take 1,000 Units by mouth daily.     fexofenadine 180 MG tablet  Commonly known as:  ALLEGRA  Take 180 mg by mouth daily.     furosemide 40 MG tablet  Commonly known as:  LASIX  Take 1 tablet (40 mg total) by mouth daily.     hydrALAZINE 50 MG tablet  Commonly known as:  APRESOLINE  Take 1 tablet (50 mg total) by mouth 2 (two) times daily.     labetalol 300 MG tablet  Commonly known as:  NORMODYNE  Take 300 mg by mouth 2 (two) times daily.     labetalol 300 MG tablet  Commonly known as:  NORMODYNE  Take 1 tablet (300 mg total) by mouth 2 (two) times daily.     LEVEMIR FLEXPEN 100 UNIT/ML Pen  Generic drug:  Insulin Detemir  Inject 30 Units into the skin 2 (two) times daily.     metFORMIN 500 MG tablet  Commonly known as:  GLUCOPHAGE  Take 500 mg by mouth 2 (two) times daily with a meal.     nitroGLYCERIN 0.4 MG SL tablet  Commonly known as:  NITROSTAT  Place 1 tablet (0.4 mg total) under the  tongue every 5 (five) minutes x 3 doses as needed for chest pain.     spironolactone 25 MG tablet  Commonly known as:  ALDACTONE  Take 1 tablet (25 mg total) by mouth daily.       Followup plans and appointments: Follow-up with Dr. Sherrie Sport in one week for follow-up of hypertension.  Follow-up with Dr. Wynonia Lawman in 3-4 weeks.  Time spent with patient to include physician time:  45 minutes   Signed: W. Doristine Church. MD [facility] 07/30/2014, 9:15 AM

## 2014-07-30 NOTE — Progress Notes (Signed)
CARE MANAGEMENT NOTE 07/30/2014  Patient:  Andrea Grant, Andrea Grant   Account Number:  [id number]  Date Initiated:  07/28/2014  Documentation initiated by:  Elissa Hefty  Subjective/Objective Assessment:   adm w ch pain, htn     Action/Plan:   lives w fam, pcp dr Bennie Dallas   Anticipated DC Date:  07/30/2014   Anticipated DC Plan:  [place]  CM consult      Choice offered to / List presented to:             Status of service:  Completed, signed off Medicare Important Message given?   (If response is "NO", the following Medicare IM given date fields will be blank) Date Medicare IM given:   Medicare IM given by:   Date Additional Medicare IM given:   Additional Medicare IM given by:    Discharge Disposition:  HOME/SELF CARE  Per UR Regulation:  Reviewed for med. necessity/level of care/duration of stay  If discussed at [place] of Stay Meetings, dates discussed:    Comments:  07/30/14- 1200- Marvetta Gibbons RN, BSN [phone] Pt seen for medication compliance- per conversation with pt - pt states that she does not have Medicaid- calll made to [facility]- message left to call pt- call made to [facility] in [place] who confirmed that no insurance was on file for pt- pt pays out of pocket for meds- she last picked up meds on 12/26 which was one of her bp meds- most of discharge meds on $4 list- coupon given to pt for crestor- pt is also on [place] which she has a full pen of- she reports that this is too expensive for her - she states that she has used 70/30  pen in past- patient assistance forms given to pt to take to her PCP in f/u on which insulin would be best- she understands that if she stays on pens she needs to seek assistance from drug company for this. She reports that she is trying to get insurance through her company but has not heard from them regarding this. Advised her to f/u with her PCP as soon as possible.

## 2014-07-30 NOTE — Progress Notes (Signed)
Reviewed discharge instructions with the patient by teachback, stressed importance of taking medication as ordered, diet education and  following up wit MD appointments.  Provided dc paperwork, prescriptions. Daughter to provide transportation home. Case manager consulting regarding medication assistance/needs Alfonzo Feller

## 2014-11-20 ENCOUNTER — Encounter: Payer: Self-pay | Admitting: *Deleted

## 2014-11-21 ENCOUNTER — Ambulatory Visit (INDEPENDENT_AMBULATORY_CARE_PROVIDER_SITE_OTHER): Payer: Medicaid Other | Admitting: Cardiology

## 2014-11-21 ENCOUNTER — Encounter: Payer: Self-pay | Admitting: Cardiology

## 2014-11-21 VITALS — BP 139/73 | HR 74 | Ht 65.0 in | Wt 229.0 lb

## 2014-11-21 DIAGNOSIS — R0789 Other chest pain: Secondary | ICD-10-CM | POA: Diagnosis not present

## 2014-11-21 DIAGNOSIS — I2510 Atherosclerotic heart disease of native coronary artery without angina pectoris: Secondary | ICD-10-CM | POA: Diagnosis not present

## 2014-11-21 DIAGNOSIS — E785 Hyperlipidemia, unspecified: Secondary | ICD-10-CM

## 2014-11-21 DIAGNOSIS — I10 Essential (primary) hypertension: Secondary | ICD-10-CM | POA: Diagnosis not present

## 2014-11-21 MED ORDER — RANITIDINE HCL 150 MG PO TABS: 150.0000 mg | ORAL_TABLET | Freq: Two times a day (BID) | ORAL | Status: DC

## 2014-11-21 NOTE — Progress Notes (Signed)
Clinical Summary [name] is a 54 y.o.female  1. Chest pain - admit 07/2014 with chest pain, mild trop elevation. Occurred in the setting of severe HTN - cath 07/2014 with non-obstructive CAD mild to moderate - echo 07/2014 LVEF 60-65%  - notes some occasional chest pain. Dull pain left chest to back, 1/10. Can occur any time. Can have some fatigue, no other associated symptoms. Better with tramadol. Not positional. Can last up to 3 hours. Similar to pain in December - compliant with meds  2.HTN - checks sometimes. Typically around 130s/80s  3. DM2 - followed by pcp  4. HL - compliant with statin - reports recent panel by pcp  5. OSA  - compliant CPAP Past Medical History  Diagnosis Date  . Hypertensive heart disease 07/27/2014  . Type 2 diabetes mellitus with peripheral neuropathy 07/27/2014  . Depression, episodic 07/27/2014  . Obesity (BMI 30-39.9)      Allergies  Allergen Reactions  . Percocet [Oxycodone-Acetaminophen] Itching    Per pt, tolerates Vicodin and APAP fine  . Penicillins Rash     Current Outpatient Prescriptions  Medication Sig Dispense Refill  . amLODipine (NORVASC) 10 MG tablet Take 10 mg by mouth daily.    Marland Kitchen amLODipine (NORVASC) 10 MG tablet Take 1 tablet (10 mg total) by mouth daily. 30 tablet 12  . aspirin EC 81 MG tablet Take 81 mg by mouth daily.    . calcium carbonate (OS-CAL) 600 MG TABS tablet Take 600 mg by mouth daily.    . cholecalciferol (VITAMIN D) 1000 UNITS tablet Take 1,000 Units by mouth daily.    . fexofenadine (ALLEGRA) 180 MG tablet Take 180 mg by mouth daily.    . furosemide (LASIX) 40 MG tablet Take 1 tablet (40 mg total) by mouth daily. 30 tablet 12  . hydrALAZINE (APRESOLINE) 50 MG tablet Take 1 tablet (50 mg total) by mouth 2 (two) times daily. 60 tablet 12  . Insulin Detemir (LEVEMIR FLEXPEN) 100 UNIT/ML Pen Inject 30 Units into the skin 2 (two) times daily.    Marland Kitchen labetalol (NORMODYNE) 300 MG tablet Take 300 mg by  mouth 2 (two) times daily.    Marland Kitchen labetalol (NORMODYNE) 300 MG tablet Take 1 tablet (300 mg total) by mouth 2 (two) times daily. 60 tablet 12  . metFORMIN (GLUCOPHAGE) 500 MG tablet Take 500 mg by mouth 2 (two) times daily with a meal.    . nitroGLYCERIN (NITROSTAT) 0.4 MG SL tablet Place 1 tablet (0.4 mg total) under the tongue every 5 (five) minutes x 3 doses as needed for chest pain. 25 tablet 12  . rosuvastatin (CRESTOR) 5 MG tablet Take 1 tablet (5 mg total) by mouth daily. 30 tablet 12  . spironolactone (ALDACTONE) 25 MG tablet Take 1 tablet (25 mg total) by mouth daily. 30 tablet 12   No current facility-administered medications for this visit.     Past Surgical History  Procedure Laterality Date  . No surgical hx    . Left heart catheterization with coronary angiogram N/A 07/29/2014    Procedure: LEFT HEART CATHETERIZATION WITH CORONARY ANGIOGRAM;  Surgeon: Sinclair Grooms, MD;  [facility] CATH LAB;  Service: Cardiovascular;  Laterality: N/A;     Allergies  Allergen Reactions  . Percocet [Oxycodone-Acetaminophen] Itching    Per pt, tolerates Vicodin and APAP fine  . Penicillins Rash      Family History  Problem Relation Age of Onset  . Cancer Mother   .  Heart disease Mother   . CVA Father      Social History [name] reports that she has never smoked. She has never used smokeless tobacco. [name] reports that she does not drink alcohol.   Review of Systems CONSTITUTIONAL: No weight loss, fever, chills, weakness or fatigue.  HEENT: Eyes: No visual loss, blurred vision, double vision or yellow sclerae.No hearing loss, sneezing, congestion, runny nose or sore throat.  SKIN: No rash or itching.  CARDIOVASCULAR: per HPI RESPIRATORY: No shortness of breath, cough or sputum.  GASTROINTESTINAL: No anorexia, nausea, vomiting or diarrhea. No abdominal pain or blood.  GENITOURINARY: No burning on urination, no polyuria NEUROLOGICAL: No headache, dizziness, syncope,  paralysis, ataxia, numbness or tingling in the extremities. No change in bowel or bladder control.  MUSCULOSKELETAL: No muscle, back pain, joint pain or stiffness.  LYMPHATICS: No enlarged nodes. No history of splenectomy.  PSYCHIATRIC: No history of depression or anxiety.  ENDOCRINOLOGIC: No reports of sweating, cold or heat intolerance. No polyuria or polydipsia.  Marland Kitchen   Physical Examination p 74 bp 139/73 Wt 229 lbs BMI 38 Gen: resting comfortably, no acute distress HEENT: no scleral icterus, pupils equal round and reactive, no palptable cervical adenopathy,  CV: RRR, no m/r/g, no JVD, no carotid bruits Resp: Clear to auscultation bilaterally GI: abdomen is soft, non-tender, non-distended, normal bowel sounds, no hepatosplenomegaly MSK: extremities are warm, no edema.  Skin: warm, no rash Neuro:  no focal deficits Psych: appropriate affect   Diagnostic Studies 07/2014 Echo Study Conclusions  - Left ventricle: The cavity size was normal. Wall thickness was increased in a pattern of moderate LVH. Systolic function was normal. The estimated ejection fraction was in the range of 60% to 65%. Wall motion was normal; there were no regional wall motion abnormalities. - Left atrium: The atrium was mildly dilated. - Pericardium, extracardiac: A trivial pericardial effusion was identified.  07/2014 Cath HEMODYNAMICS: Aortic pressure 162/93 mmHg; LV pressure 163/23 mmHg; LVEDP 27 mmHg   ANGIOGRAPHIC DATA: The left main coronary artery is normal.  The left anterior descending artery is large and wraps around the left ventricular apex. There is mid vessel eccentric 30% narrowing and distal vessel eccentric 60-70% narrowing. The large first diagonal contains focal mid vessel 50% narrowing..  The left circumflex artery is patent. 2 marginal branches arise from the circumflex. He can marginal is a dominant of the 2 vessels. The proximal obtuse marginal contains a 50%  narrowing..  The right coronary artery is dominant and widely patent.   LEFT VENTRICULOGRAM: Left ventricular angiogram was done in the 30 RAO projection and revealed brisk symmetric contractility with EF 60%. Elevated end-diastolic pressures noted.    IMPRESSIONS: 1. Nonobstructive coronary artery disease involving the left anterior descending, the first diagonal, and the first obtuse marginal branch.  2. Normal left ventricular systolic function with EF of at least 60%, left ventricular hypertrophy, and evidence of diastolic heart failure (LVEDP greater than 23 mmHg )  RECOMMENDATION:   1. Aggressive management of hypertension  2. Eligible for discharge when blood pressure is controlled.     Assessment and Plan  1. CAD - mild to moderate non-obstructive disease by recent cath - occasional atypical chest pain better with tramadol, no cardiac symptoms - continue risk factor modification  2. HTN - at goal, continue current meds - request pcp notes, would suggest ACE-I in setting of HTN, CAD, and DM2 unless contraindication  3. HL - request labs from pcp - continue current statin  4. OSA - continue CPAP      Arnoldo Lenis, M.D., F.A.C.C.

## 2014-11-21 NOTE — Patient Instructions (Signed)
   Begin Zantac 150mg  twice a day  - may buy over the counter Continue all other medications.   Your physician wants you to follow up in: 6 months.  You will receive a reminder letter in the mail one-two months in advance.  If you don't receive a letter, please call our office to schedule the follow up appointment

## 2014-11-24 ENCOUNTER — Encounter: Payer: Self-pay | Admitting: *Deleted

## 2015-12-15 ENCOUNTER — Ambulatory Visit: Payer: Medicaid Other | Admitting: Cardiology

## 2015-12-17 ENCOUNTER — Ambulatory Visit: Payer: Medicaid Other | Admitting: Cardiology

## 2015-12-17 NOTE — Progress Notes (Unsigned)
Patient ID: Kandance Chavaria, female   DOB: June 05, 1961, 55 y.o.   MRN: [id number]     Clinical Summary Ms. Bolling is a 55 y.o.female  1. Chest pain - admit 07/2014 with chest pain, mild trop elevation. Occurred in the setting of severe HTN - cath 07/2014 with non-obstructive CAD mild to moderate - echo 07/2014 LVEF 60-65%  - notes some occasional chest pain. Dull pain left chest to back, 1/10. Can occur any time. Can have some fatigue, no other associated symptoms. Better with tramadol. Not positional. Can last up to 3 hours. Similar to pain in December - compliant with meds  2.HTN - checks sometimes. Typically around 130s/80s  3. DM2 - followed by pcp  4. HL - compliant with statin - reports recent panel by pcp  5. OSA  - compliant CPAP Past Medical History  Diagnosis Date  . Hypertensive heart disease 07/27/2014  . Type 2 diabetes mellitus with peripheral neuropathy 07/27/2014  . Depression, episodic 07/27/2014  . Obesity (BMI 30-39.9)      Allergies  Allergen Reactions  . Percocet [Oxycodone-Acetaminophen] Itching    Per pt, tolerates Vicodin and APAP fine  . Penicillins Rash     Current Outpatient Prescriptions  Medication Sig Dispense Refill  . amLODipine (NORVASC) 10 MG tablet Take 10 mg by mouth daily.    Marland Kitchen amLODipine (NORVASC) 10 MG tablet Take 1 tablet (10 mg total) by mouth daily. 30 tablet 12  . aspirin EC 81 MG tablet Take 81 mg by mouth daily.    . calcium carbonate (OS-CAL) 600 MG TABS tablet Take 600 mg by mouth daily.    . cholecalciferol (VITAMIN D) 1000 UNITS tablet Take 1,000 Units by mouth daily.    . fexofenadine (ALLEGRA) 180 MG tablet Take 180 mg by mouth daily.    . furosemide (LASIX) 40 MG tablet Take 1 tablet (40 mg total) by mouth daily. 30 tablet 12  . hydrALAZINE (APRESOLINE) 50 MG tablet Take 1 tablet (50 mg total) by mouth 2 (two) times daily. 60 tablet 12  . Insulin Detemir (LEVEMIR FLEXPEN) 100 UNIT/ML Pen Inject 30 Units into the skin  2 (two) times daily.    Marland Kitchen labetalol (NORMODYNE) 300 MG tablet Take 300 mg by mouth 2 (two) times daily.    Marland Kitchen labetalol (NORMODYNE) 300 MG tablet Take 1 tablet (300 mg total) by mouth 2 (two) times daily. 60 tablet 12  . metFORMIN (GLUCOPHAGE) 500 MG tablet Take 500 mg by mouth 2 (two) times daily with a meal.    . nitroGLYCERIN (NITROSTAT) 0.4 MG SL tablet Place 1 tablet (0.4 mg total) under the tongue every 5 (five) minutes x 3 doses as needed for chest pain. 25 tablet 12  . ranitidine (ZANTAC) 150 MG tablet Take 1 tablet (150 mg total) by mouth 2 (two) times daily.    . rosuvastatin (CRESTOR) 5 MG tablet Take 1 tablet (5 mg total) by mouth daily. 30 tablet 12  . spironolactone (ALDACTONE) 25 MG tablet Take 1 tablet (25 mg total) by mouth daily. 30 tablet 12   No current facility-administered medications for this visit.     Past Surgical History  Procedure Laterality Date  . No surgical hx    . Left heart catheterization with coronary angiogram N/A 07/29/2014    Procedure: LEFT HEART CATHETERIZATION WITH CORONARY ANGIOGRAM;  Surgeon: Sinclair Grooms, MD;  Location: [facility] CATH LAB;  Service: Cardiovascular;  Laterality: N/A;     Allergies  Allergen Reactions  .  Percocet [Oxycodone-Acetaminophen] Itching    Per pt, tolerates Vicodin and APAP fine  . Penicillins Rash      Family History  Problem Relation Age of Onset  . Cancer Mother   . Heart disease Mother   . CVA Father      Social History Ms. Spoonamore reports that she has never smoked. She has never used smokeless tobacco. Ms. Dose reports that she does not drink alcohol.   Review of Systems CONSTITUTIONAL: No weight loss, fever, chills, weakness or fatigue.  HEENT: Eyes: No visual loss, blurred vision, double vision or yellow sclerae.No hearing loss, sneezing, congestion, runny nose or sore throat.  SKIN: No rash or itching.  CARDIOVASCULAR:  RESPIRATORY: No shortness of breath, cough or sputum.  GASTROINTESTINAL: No  anorexia, nausea, vomiting or diarrhea. No abdominal pain or blood.  GENITOURINARY: No burning on urination, no polyuria NEUROLOGICAL: No headache, dizziness, syncope, paralysis, ataxia, numbness or tingling in the extremities. No change in bowel or bladder control.  MUSCULOSKELETAL: No muscle, back pain, joint pain or stiffness.  LYMPHATICS: No enlarged nodes. No history of splenectomy.  PSYCHIATRIC: No history of depression or anxiety.  ENDOCRINOLOGIC: No reports of sweating, cold or heat intolerance. No polyuria or polydipsia.  Marland Kitchen   Physical Examination There were no vitals filed for this visit. There were no vitals filed for this visit.  Gen: resting comfortably, no acute distress HEENT: no scleral icterus, pupils equal round and reactive, no palptable cervical adenopathy,  CV Resp: Clear to auscultation bilaterally GI: abdomen is soft, non-tender, non-distended, normal bowel sounds, no hepatosplenomegaly MSK: extremities are warm, no edema.  Skin: warm, no rash Neuro:  no focal deficits Psych: appropriate affect   Diagnostic Studies 07/2014 Echo Study Conclusions  - Left ventricle: The cavity size was normal. Wall thickness was increased in a pattern of moderate LVH. Systolic function was normal. The estimated ejection fraction was in the range of 60% to 65%. Wall motion was normal; there were no regional wall motion abnormalities. - Left atrium: The atrium was mildly dilated. - Pericardium, extracardiac: A trivial pericardial effusion was identified.  07/2014 Cath HEMODYNAMICS: Aortic pressure 162/93 mmHg; LV pressure 163/23 mmHg; LVEDP 27 mmHg   ANGIOGRAPHIC DATA: The left main coronary artery is normal.  The left anterior descending artery is large and wraps around the left ventricular apex. There is mid vessel eccentric 30% narrowing and distal vessel eccentric 60-70% narrowing. The large first diagonal contains focal mid vessel 50% narrowing..  The  left circumflex artery is patent. 2 marginal branches arise from the circumflex. He can marginal is a dominant of the 2 vessels. The proximal obtuse marginal contains a 50% narrowing..  The right coronary artery is dominant and widely patent.   LEFT VENTRICULOGRAM: Left ventricular angiogram was done in the 30 RAO projection and revealed brisk symmetric contractility with EF 60%. Elevated end-diastolic pressures noted.    IMPRESSIONS: 1. Nonobstructive coronary artery disease involving the left anterior descending, the first diagonal, and the first obtuse marginal Gaetan Spieker.  2. Normal left ventricular systolic function with EF of at least 60%, left ventricular hypertrophy, and evidence of diastolic heart failure (LVEDP greater than 23 mmHg )  POST CATH RECOMMENDATION:   1. Aggressive management of hypertension  2. Eligible for discharge when blood pressure is controlled.     Assessment and Plan  1. CAD - mild to moderate non-obstructive disease by recent cath - occasional atypical chest pain better with tramadol, no cardiac symptoms - continue risk factor  modification  2. HTN - at goal, continue current meds - request pcp notes, would suggest ACE-I in setting of HTN, CAD, and DM2 unless contraindication  3. HL - request labs from pcp - continue current statin  4. OSA - continue CPAP      Arnoldo Lenis, M.D., F.A.C.C.

## 2015-12-18 ENCOUNTER — Encounter: Payer: Self-pay | Admitting: Cardiology

## 2016-02-19 ENCOUNTER — Encounter: Payer: Self-pay | Admitting: Cardiology

## 2016-02-19 ENCOUNTER — Encounter (INDEPENDENT_AMBULATORY_CARE_PROVIDER_SITE_OTHER): Payer: Self-pay | Admitting: Cardiology

## 2016-02-19 NOTE — Progress Notes (Signed)
Clinical Summary Ms. Braman is a 55 y.o.female seen today for follow up of the following medical problems.   1. Chest pain - admit 07/2014 with chest pain, mild trop elevation. Occurred in the setting of severe HTN - cath 07/2014 with non-obstructive CAD mild to moderate - echo 07/2014 LVEF 60-65%  - notes some occasional chest pain. Dull pain left chest to back, 1/10. Can occur any time. Can have some fatigue, no other associated symptoms. Better with tramadol. Not positional. Can last up to 3 hours. Similar to pain in December - compliant with meds  2.HTN - checks sometimes. Typically around 130s/80s  3. DM2 - followed by pcp  4. HL - compliant with statin - reports recent panel by pcp  5. OSA  - compliant CPAP Past Medical History  Diagnosis Date  . Hypertensive heart disease 07/27/2014  . Type 2 diabetes mellitus with peripheral neuropathy 07/27/2014  . Depression, episodic 07/27/2014  . Obesity (BMI 30-39.9)      Allergies  Allergen Reactions  . Percocet [Oxycodone-Acetaminophen] Itching    Per pt, tolerates Vicodin and APAP fine  . Penicillins Rash     Current Outpatient Prescriptions  Medication Sig Dispense Refill  . amLODipine (NORVASC) 10 MG tablet Take 10 mg by mouth daily.    Marland Kitchen amLODipine (NORVASC) 10 MG tablet Take 1 tablet (10 mg total) by mouth daily. 30 tablet 12  . aspirin EC 81 MG tablet Take 81 mg by mouth daily.    . calcium carbonate (OS-CAL) 600 MG TABS tablet Take 600 mg by mouth daily.    . cholecalciferol (VITAMIN D) 1000 UNITS tablet Take 1,000 Units by mouth daily.    . fexofenadine (ALLEGRA) 180 MG tablet Take 180 mg by mouth daily.    . furosemide (LASIX) 40 MG tablet Take 1 tablet (40 mg total) by mouth daily. 30 tablet 12  . hydrALAZINE (APRESOLINE) 50 MG tablet Take 1 tablet (50 mg total) by mouth 2 (two) times daily. 60 tablet 12  . Insulin Detemir (LEVEMIR FLEXPEN) 100 UNIT/ML Pen Inject 30 Units into the skin 2 (two) times  daily.    Marland Kitchen labetalol (NORMODYNE) 300 MG tablet Take 300 mg by mouth 2 (two) times daily.    Marland Kitchen labetalol (NORMODYNE) 300 MG tablet Take 1 tablet (300 mg total) by mouth 2 (two) times daily. 60 tablet 12  . metFORMIN (GLUCOPHAGE) 500 MG tablet Take 500 mg by mouth 2 (two) times daily with a meal.    . nitroGLYCERIN (NITROSTAT) 0.4 MG SL tablet Place 1 tablet (0.4 mg total) under the tongue every 5 (five) minutes x 3 doses as needed for chest pain. 25 tablet 12  . ranitidine (ZANTAC) 150 MG tablet Take 1 tablet (150 mg total) by mouth 2 (two) times daily.    . rosuvastatin (CRESTOR) 5 MG tablet Take 1 tablet (5 mg total) by mouth daily. 30 tablet 12  . spironolactone (ALDACTONE) 25 MG tablet Take 1 tablet (25 mg total) by mouth daily. 30 tablet 12   No current facility-administered medications for this visit.     Past Surgical History  Procedure Laterality Date  . No surgical hx    . Left heart catheterization with coronary angiogram N/A 07/29/2014    Procedure: LEFT HEART CATHETERIZATION WITH CORONARY ANGIOGRAM;  Surgeon: Sinclair Grooms, MD;  [facility]: [facility] CATH LAB;  Service: Cardiovascular;  Laterality: N/A;     Allergies  Allergen Reactions  . Percocet [Oxycodone-Acetaminophen] Itching  Per pt, tolerates Vicodin and APAP fine  . Penicillins Rash      Family History  Problem Relation Age of Onset  . Cancer Mother   . Heart disease Mother   . CVA Father      Social History Ms. Schueler reports that she has never smoked. She has never used smokeless tobacco. Ms. Pritt reports that she does not drink alcohol.   Review of Systems CONSTITUTIONAL: No weight loss, fever, chills, weakness or fatigue.  HEENT: Eyes: No visual loss, blurred vision, double vision or yellow sclerae.No hearing loss, sneezing, congestion, runny nose or sore throat.  SKIN: No rash or itching.  CARDIOVASCULAR:  RESPIRATORY: No shortness of breath, cough or sputum.  GASTROINTESTINAL: No anorexia,  nausea, vomiting or diarrhea. No abdominal pain or blood.  GENITOURINARY: No burning on urination, no polyuria NEUROLOGICAL: No headache, dizziness, syncope, paralysis, ataxia, numbness or tingling in the extremities. No change in bowel or bladder control.  MUSCULOSKELETAL: No muscle, back pain, joint pain or stiffness.  LYMPHATICS: No enlarged nodes. No history of splenectomy.  PSYCHIATRIC: No history of depression or anxiety.  ENDOCRINOLOGIC: No reports of sweating, cold or heat intolerance. No polyuria or polydipsia.  Marland Kitchen   Physical Examination There were no vitals filed for this visit. There were no vitals filed for this visit.  Gen: resting comfortably, no acute distress HEENT: no scleral icterus, pupils equal round and reactive, no palptable cervical adenopathy,  CV Resp: Clear to auscultation bilaterally GI: abdomen is soft, non-tender, non-distended, normal bowel sounds, no hepatosplenomegaly MSK: extremities are warm, no edema.  Skin: warm, no rash Neuro:  no focal deficits Psych: appropriate affect   Diagnostic Studies 07/2014 Cath HEMODYNAMICS: Aortic pressure 162/93 mmHg; LV pressure 163/23 mmHg; LVEDP 27 mmHg   ANGIOGRAPHIC DATA: The left main coronary artery is normal.  The left anterior descending artery is large and wraps around the left ventricular apex. There is mid vessel eccentric 30% narrowing and distal vessel eccentric 60-70% narrowing. The large first diagonal contains focal mid vessel 50% narrowing..  The left circumflex artery is patent. 2 marginal branches arise from the circumflex. He can marginal is a dominant of the 2 vessels. The proximal obtuse marginal contains a 50% narrowing..  The right coronary artery is dominant and widely patent.   LEFT VENTRICULOGRAM: Left ventricular angiogram was done in the 30 RAO projection and revealed brisk symmetric contractility with EF 60%. Elevated end-diastolic pressures noted.    IMPRESSIONS: 1.  Nonobstructive coronary artery disease involving the left anterior descending, the first diagonal, and the first obtuse marginal [place].  2. Normal left ventricular systolic function with EF of at least 60%, left ventricular hypertrophy, and evidence of diastolic heart failure (LVEDP greater than 23 mmHg )  POST CATH RECOMMENDATION:   1. Aggressive management of hypertension  2. Eligible for discharge when blood pressure is controlled.     Assessment and Plan   1. CAD - mild to moderate non-obstructive disease by recent cath - occasional atypical chest pain better with tramadol, no cardiac symptoms - continue risk factor modification  2. HTN - at goal, continue current meds - request pcp notes, would suggest ACE-I in setting of HTN, CAD, and DM2 unless contraindication  3. HL - request labs from pcp - continue current statin  4. OSA - continue CPAP     Arnoldo Lenis, M.D., F.A.C.C.

## 2016-04-14 NOTE — Progress Notes (Signed)
This encounter was created in error - please disregard.

## 2016-12-13 ENCOUNTER — Encounter: Payer: Self-pay | Admitting: *Deleted

## 2016-12-14 ENCOUNTER — Encounter: Payer: Self-pay | Admitting: Cardiology

## 2016-12-14 ENCOUNTER — Encounter: Payer: Self-pay | Admitting: *Deleted

## 2016-12-14 ENCOUNTER — Ambulatory Visit (INDEPENDENT_AMBULATORY_CARE_PROVIDER_SITE_OTHER): Payer: BLUE CROSS/BLUE SHIELD | Admitting: Cardiology

## 2016-12-14 VITALS — BP 164/89 | HR 74 | Ht 65.0 in | Wt 210.0 lb

## 2016-12-14 DIAGNOSIS — E782 Mixed hyperlipidemia: Secondary | ICD-10-CM

## 2016-12-14 DIAGNOSIS — E1142 Type 2 diabetes mellitus with diabetic polyneuropathy: Secondary | ICD-10-CM

## 2016-12-14 DIAGNOSIS — I10 Essential (primary) hypertension: Secondary | ICD-10-CM

## 2016-12-14 DIAGNOSIS — I2510 Atherosclerotic heart disease of native coronary artery without angina pectoris: Secondary | ICD-10-CM | POA: Diagnosis not present

## 2016-12-14 NOTE — Progress Notes (Addendum)
Clinical Summary Andrea Grant is a 56 y.o.female seen today for follow up of the following medical problems.   1. Chest pain - admit 07/2014 with chest pain, mild trop elevation. Occurred in the setting of severe HTN - cath 07/2014 with non-obstructive CAD mild to moderate - echo 07/2014 LVEF 60-65%  - can have some chest pain with walking long distances. Feeling of needing to cough.   2.HTN - has not taken meds yet today. Reports overall compliance  3. DM2 - followed by pcp  4. HL - she is off crestor for unclear reasons.  - reports recent panel by pcp  5. OSA  - compliant CPAP  Past Medical History:  Diagnosis Date  . Depression, episodic 07/27/2014  . Hypertensive heart disease 07/27/2014  . Obesity (BMI 30-39.9)   . Type 2 diabetes mellitus with peripheral neuropathy (HCC) 07/27/2014     Allergies  Allergen Reactions  . Percocet [Oxycodone-Acetaminophen] Itching    Per pt, tolerates Vicodin and APAP fine  . Penicillins Rash     Current Outpatient Prescriptions  Medication Sig Dispense Refill  . amLODipine (NORVASC) 10 MG tablet Take 10 mg by mouth daily.    Marland Kitchen amLODipine (NORVASC) 10 MG tablet Take 1 tablet (10 mg total) by mouth daily. 30 tablet 12  . aspirin EC 81 MG tablet Take 81 mg by mouth daily.    . calcium carbonate (OS-CAL) 600 MG TABS tablet Take 600 mg by mouth daily.    . cholecalciferol (VITAMIN D) 1000 UNITS tablet Take 1,000 Units by mouth daily.    . fexofenadine (ALLEGRA) 180 MG tablet Take 180 mg by mouth daily.    . furosemide (LASIX) 40 MG tablet Take 1 tablet (40 mg total) by mouth daily. 30 tablet 12  . hydrALAZINE (APRESOLINE) 50 MG tablet Take 1 tablet (50 mg total) by mouth 2 (two) times daily. 60 tablet 12  . Insulin Detemir (LEVEMIR FLEXPEN) 100 UNIT/ML Pen Inject 30 Units into the skin 2 (two) times daily.    Marland Kitchen labetalol (NORMODYNE) 300 MG tablet Take 300 mg by mouth 2 (two) times daily.    Marland Kitchen labetalol (NORMODYNE) 300 MG  tablet Take 1 tablet (300 mg total) by mouth 2 (two) times daily. 60 tablet 12  . metFORMIN (GLUCOPHAGE) 500 MG tablet Take 500 mg by mouth 2 (two) times daily with a meal.    . nitroGLYCERIN (NITROSTAT) 0.4 MG SL tablet Place 1 tablet (0.4 mg total) under the tongue every 5 (five) minutes x 3 doses as needed for chest pain. 25 tablet 12  . ranitidine (ZANTAC) 150 MG tablet Take 1 tablet (150 mg total) by mouth 2 (two) times daily.    . rosuvastatin (CRESTOR) 5 MG tablet Take 1 tablet (5 mg total) by mouth daily. 30 tablet 12  . spironolactone (ALDACTONE) 25 MG tablet Take 1 tablet (25 mg total) by mouth daily. 30 tablet 12   No current facility-administered medications for this visit.      Past Surgical History:  Procedure Laterality Date  . LEFT HEART CATHETERIZATION WITH CORONARY ANGIOGRAM N/A 07/29/2014   Procedure: LEFT HEART CATHETERIZATION WITH CORONARY ANGIOGRAM;  Surgeon: Sinclair Grooms, MD;  Location: [facility] CATH LAB;  Service: Cardiovascular;  Laterality: N/A;  . no surgical hx       Allergies  Allergen Reactions  . Percocet [Oxycodone-Acetaminophen] Itching    Per pt, tolerates Vicodin and APAP fine  . Penicillins Rash  Family History  Problem Relation Age of Onset  . Cancer Mother   . Heart disease Mother   . CVA Father      Social History Andrea Grant reports that she has never smoked. She has never used smokeless tobacco. Andrea Grant reports that she does not drink alcohol.   Review of Systems CONSTITUTIONAL: No weight loss, fever, chills, weakness or fatigue.  HEENT: Eyes: No visual loss, blurred vision, double vision or yellow sclerae.No hearing loss, sneezing, congestion, runny nose or sore throat.  SKIN: No rash or itching.  CARDIOVASCULAR: per hpi RESPIRATORY: No shortness of breath, cough or sputum.  GASTROINTESTINAL: No anorexia, nausea, vomiting or diarrhea. No abdominal pain or blood.  GENITOURINARY: No burning on urination, no  polyuria NEUROLOGICAL: No headache, dizziness, syncope, paralysis, ataxia, numbness or tingling in the extremities. No change in bowel or bladder control.  MUSCULOSKELETAL: No muscle, back pain, joint pain or stiffness.  LYMPHATICS: No enlarged nodes. No history of splenectomy.  PSYCHIATRIC: No history of depression or anxiety.  ENDOCRINOLOGIC: No reports of sweating, cold or heat intolerance. No polyuria or polydipsia.  Marland Kitchen   Physical Examination Vitals:   12/14/16 0845  BP: (!) 164/89  Pulse: 74   Vitals:   12/14/16 0845  Weight: 210 lb (95.3 kg)  Height: 5\' 5"  (1.651 m)    Gen: resting comfortably, no acute distress HEENT: no scleral icterus, pupils equal round and reactive, no palptable cervical adenopathy,  CV: RRR, no mr/g, no jvd Resp: Clear to auscultation bilaterally GI: abdomen is soft, non-tender, non-distended, normal bowel sounds, no hepatosplenomegaly MSK: extremities are warm, no edema.  Skin: warm, no rash Neuro:  no focal deficits Psych: appropriate affect   Diagnostic Studies 07/2014 Echo Study Conclusions  - Left ventricle: The cavity size was normal. Wall thickness was increased in a pattern of moderate LVH. Systolic function was normal. The estimated ejection fraction was in the range of 60% to 65%. Wall motion was normal; there were no regional wall motion abnormalities. - Left atrium: The atrium was mildly dilated. - Pericardium, extracardiac: A trivial pericardial effusion was identified.  07/2014 Cath HEMODYNAMICS: Aortic pressure 162/93 mmHg; LV pressure 163/23 mmHg; LVEDP 27 mmHg   ANGIOGRAPHIC DATA: The left main coronary artery is normal.  The left anterior descending artery is large and wraps around the left ventricular apex. There is mid vessel eccentric 30% narrowing and distal vessel eccentric 60-70% narrowing. The large first diagonal contains focal mid vessel 50% narrowing..  The left circumflex artery is patent. 2  marginal branches arise from the circumflex. He can marginal is a dominant of the 2 vessels. The proximal obtuse marginal contains a 50% narrowing..  The right coronary artery is dominant and widely patent.   LEFT VENTRICULOGRAM: Left ventricular angiogram was done in the 30 RAO projection and revealed brisk symmetric contractility with EF 60%. Elevated end-diastolic pressures noted.    IMPRESSIONS: 1. Nonobstructive coronary artery disease involving the left anterior descending, the first diagonal, and the first obtuse marginal branch.  2. Normal left ventricular systolic function with EF of at least 60%, left ventricular hypertrophy, and evidence of diastolic heart failure (LVEDP greater than 23 mmHg )  POST CATH RECOMMENDATION:   1. Aggressive management of hypertension  2. Eligible for discharge when blood pressure is controlled.      Assessment and Plan   1. CAD - mild to moderate non-obstructive disease by recent cath - occasional atypical chest pain overall stable -continue to monitor at this  time. EKG in clinic shows SR, no acute ischemic changes  2. HTN - elevated in clinic, she has not taken meds yet tday - continue to monitor. Submit bp log in 1 week  3. HL - request labs from pcp. Would recommend statin unless contraindication given her DM2 and CAD history.  - repeat labs  4. OSA -she will continue CPAP    F/u 1 year   Arnoldo Lenis, M.D.

## 2016-12-14 NOTE — Patient Instructions (Signed)
Your physician wants you to follow-up in: [place] will receive a reminder letter in the mail two months in advance. If you don't receive a letter, please call our office to schedule the follow-up appointment.  Your physician recommends that you continue on your current medications as directed. Please refer to the Current Medication list given to you today.  Your physician has requested that you regularly monitor and record your blood pressure readings at home FOR [address] [place]. Please use the same machine at the same time of day to check your readings and record them to bring to your follow-up visit.  Your physician recommends that you return for lab work - PLEASE FAST 6-8 HOURS PRIOR TO LAB WORK CBC/TSH/BMP/LIPIDS/HGBA1C/MG  Thank you for choosing [facility]!!

## 2019-08-22 ENCOUNTER — Encounter: Payer: Self-pay | Admitting: *Deleted

## 2019-08-22 ENCOUNTER — Telehealth: Payer: Self-pay | Admitting: Diagnostic Neuroimaging

## 2019-08-22 NOTE — Telephone Encounter (Signed)
Pt cancelled her New pt appointment because she is sick, she will call back to r/s

## 2019-08-23 ENCOUNTER — Ambulatory Visit: Payer: BLUE CROSS/BLUE SHIELD | Admitting: Diagnostic Neuroimaging

## 2019-12-03 ENCOUNTER — Encounter: Payer: Self-pay | Admitting: Cardiology

## 2019-12-03 ENCOUNTER — Ambulatory Visit (INDEPENDENT_AMBULATORY_CARE_PROVIDER_SITE_OTHER): Payer: 59 | Admitting: Cardiology

## 2019-12-03 ENCOUNTER — Other Ambulatory Visit: Payer: Self-pay

## 2019-12-03 VITALS — BP 154/66 | HR 82 | Ht 65.0 in | Wt 220.2 lb

## 2019-12-03 DIAGNOSIS — E782 Mixed hyperlipidemia: Secondary | ICD-10-CM | POA: Diagnosis not present

## 2019-12-03 DIAGNOSIS — I10 Essential (primary) hypertension: Secondary | ICD-10-CM | POA: Diagnosis not present

## 2019-12-03 DIAGNOSIS — I2510 Atherosclerotic heart disease of native coronary artery without angina pectoris: Secondary | ICD-10-CM | POA: Diagnosis not present

## 2019-12-03 DIAGNOSIS — G4730 Sleep apnea, unspecified: Secondary | ICD-10-CM

## 2019-12-03 MED ORDER — HYDRALAZINE HCL 50 MG PO TABS: 75.0000 mg | ORAL_TABLET | Freq: Two times a day (BID) | ORAL | 1 refills | Status: DC

## 2019-12-03 NOTE — Progress Notes (Signed)
Clinical Summary Ms. Horsford is a 59 y.o.female seen today for follow up of the following medical problems.   1. Chest pain - admit 07/2014 with chest pain, mild trop elevation. Occurred in the setting of severe HTN - cath 07/2014 with non-obstructive CAD mild to moderate - echo 07/2014 LVEF 60-65%    - no significant chest pains recently   2.HTN - compliant with meds  3. DM2 - followed by pcp  4. HL Compliant with statin, labs followed by pcp  5. OSA  - compliant CPAP - has not been followed in long time.     Past Medical History:  Diagnosis Date  . Anxiety disorder   . Carpal tunnel syndrome, right   . Cyst of skin   . Depression, episodic 07/27/2014  . Hypertensive heart disease 07/27/2014  . Insomnia   . Lumbar radiculopathy   . Non-smoker   . Obesity (BMI 30-39.9)   . Type 2 diabetes mellitus with peripheral neuropathy (HCC) 07/27/2014     Allergies  Allergen Reactions  . Percocet [Oxycodone-Acetaminophen] Itching    Per pt, tolerates Vicodin and APAP fine  . Penicillins Rash     Current Outpatient Medications  Medication Sig Dispense Refill  . amLODipine (NORVASC) 10 MG tablet Take 1 tablet (10 mg total) by mouth daily. 30 tablet 12  . aspirin EC 81 MG tablet Take 81 mg by mouth daily.    . calcium carbonate (OS-CAL) 600 MG TABS tablet Take 600 mg by mouth daily.    . cholecalciferol (VITAMIN D) 1000 UNITS tablet Take 1,000 Units by mouth daily.    . DULoxetine (CYMBALTA) 30 MG capsule Take 30 mg by mouth daily.    Marland Kitchen gabapentin (NEURONTIN) 300 MG capsule Take 300 mg by mouth 2 (two) times daily.    Marland Kitchen glimepiride (AMARYL) 4 MG tablet Take 4 mg by mouth daily.    . hydrALAZINE (APRESOLINE) 50 MG tablet Take 1 tablet (50 mg total) by mouth 2 (two) times daily. 60 tablet 12  . insulin aspart protamine - aspart (NOVOLOG MIX 70/30 FLEXPEN) (70-30) 100 UNIT/ML FlexPen Inject into the skin. 32 units in AM and 30 units in PM    . labetalol  (NORMODYNE) 300 MG tablet Take 1 tablet (300 mg total) by mouth 2 (two) times daily. 60 tablet 12  . LORazepam (ATIVAN) 1 MG tablet Take 1 mg by mouth at bedtime.    . metFORMIN (GLUCOPHAGE) 1000 MG tablet Take 1,000 mg by mouth 2 (two) times daily with a meal.    . nitroGLYCERIN (NITROSTAT) 0.4 MG SL tablet Place 1 tablet (0.4 mg total) under the tongue every 5 (five) minutes x 3 doses as needed for chest pain. 25 tablet 12   No current facility-administered medications for this visit.     Past Surgical History:  Procedure Laterality Date  . LEFT HEART CATHETERIZATION WITH CORONARY ANGIOGRAM N/A 07/29/2014   Procedure: LEFT HEART CATHETERIZATION WITH CORONARY ANGIOGRAM;  Surgeon: Sinclair Grooms, MD;  Location: [facility] CATH LAB;  Service: Cardiovascular;  Laterality: N/A;  . no surgical hx       Allergies  Allergen Reactions  . Percocet [Oxycodone-Acetaminophen] Itching    Per pt, tolerates Vicodin and APAP fine  . Penicillins Rash      Family History  Problem Relation Age of Onset  . Cancer Mother   . Heart disease Mother   . CVA Father      Social History Ms. Gellman reports  that she has never smoked. She has never used smokeless tobacco. Ms. Lesch reports no history of alcohol use.   Review of Systems CONSTITUTIONAL: No weight loss, fever, chills, weakness or fatigue.  HEENT: Eyes: No visual loss, blurred vision, double vision or yellow sclerae.No hearing loss, sneezing, congestion, runny nose or sore throat.  SKIN: No rash or itching.  CARDIOVASCULAR: per hpi RESPIRATORY: No shortness of breath, cough or sputum.  GASTROINTESTINAL: No anorexia, nausea, vomiting or diarrhea. No abdominal pain or blood.  GENITOURINARY: No burning on urination, no polyuria NEUROLOGICAL: No headache, dizziness, syncope, paralysis, ataxia, numbness or tingling in the extremities. No change in bowel or bladder control.  MUSCULOSKELETAL: No muscle, back pain, joint pain or stiffness.    LYMPHATICS: No enlarged nodes. No history of splenectomy.  PSYCHIATRIC: No history of depression or anxiety.  ENDOCRINOLOGIC: No reports of sweating, cold or heat intolerance. No polyuria or polydipsia.  Marland Kitchen   Physical Examination Today's Vitals   12/03/19 1552  BP: (!) 154/66  Pulse: 82  SpO2: 98%  Weight: 220 lb 3.2 oz (99.9 kg)  Height: 5\' 5"  (1.651 m)   Body mass index is 36.64 kg/m.  Gen: resting comfortably, no acute distress HEENT: no scleral icterus, pupils equal round and reactive, no palptable cervical adenopathy,  CV: RRR, 2/6 systolic murmur rusb, no jvd Resp: Clear to auscultation bilaterally GI: abdomen is soft, non-tender, non-distended, normal bowel sounds, no hepatosplenomegaly MSK: extremities are warm, no edema.  Skin: warm, no rash Neuro:  no focal deficits Psych: appropriate affect   Diagnostic Studies 07/2014 Echo Study Conclusions  - Left ventricle: The cavity size was normal. Wall thickness was increased in a pattern of moderate LVH. Systolic function was normal. The estimated ejection fraction was in the range of 60% to 65%. Wall motion was normal; there were no regional wall motion abnormalities. - Left atrium: The atrium was mildly dilated. - Pericardium, extracardiac: A trivial pericardial effusion was identified.  07/2014 Cath HEMODYNAMICS: Aortic pressure 162/93 mmHg; LV pressure 163/23 mmHg; LVEDP 27 mmHg   ANGIOGRAPHIC DATA: The left main coronary artery is normal.  The left anterior descending artery is large and wraps around the left ventricular apex. There is mid vessel eccentric 30% narrowing and distal vessel eccentric 60-70% narrowing. The large first diagonal contains focal mid vessel 50% narrowing..  The left circumflex artery is patent. 2 marginal branches arise from the circumflex. He can marginal is a dominant of the 2 vessels. The proximal obtuse marginal contains a 50% narrowing..  The right coronary  artery is dominant and widely patent.   LEFT VENTRICULOGRAM: Left ventricular angiogram was done in the 30 RAO projection and revealed brisk symmetric contractility with EF 60%. Elevated end-diastolic pressures noted.    IMPRESSIONS: 1. Nonobstructive coronary artery disease involving the left anterior descending, the first diagonal, and the first obtuse marginal Kennedie Pard[place].  2. Normal left ventricular systolic function with EF of at least 60%, left ventricular hypertrophy, and evidence of diastolic heart failure (LVEDP greater than 23 mmHg )  POST CATH RECOMMENDATION:   1. Aggressive management of hypertension  2. Eligible for discharge when blood pressure is controlled.     Assessment and Plan  1. CAD - mild to moderate non-obstructive disease by recent cath -no symptoms, continue current meds  2. HTN - above goal, increase hydral to 75mg  bid  3. HL -continue statin, request labs from pcp  4. OSA -has not been reevaluated in several years, refer to Dr Radford Pax for  evaluation   F/u 1 year   Arnoldo Lenis, M.D.

## 2019-12-03 NOTE — Patient Instructions (Signed)
Your physician wants you to follow-up in: [place] will receive a reminder letter in the mail two months in advance. If you don't receive a letter, please call our office to schedule the follow-up appointment.  Your physician has recommended you make the following change in your medication:   INCREASE HYDRALAZINE 75 MG (1 AND 1/2 TABLETS) TWICE DAILY   You have been referred to DR TURNER   Thank you for choosing [facility]!!

## 2019-12-04 ENCOUNTER — Encounter: Payer: Self-pay | Admitting: *Deleted

## 2019-12-04 ENCOUNTER — Telehealth: Payer: Self-pay | Admitting: Cardiology

## 2019-12-04 NOTE — Telephone Encounter (Signed)
Pre-cert Verification for the following procedure    Patient is being referred to Dr. Fransico Him for sleep a[nea,unspecified type.

## 2019-12-13 ENCOUNTER — Encounter: Payer: Self-pay | Admitting: Gastroenterology

## 2019-12-14 ENCOUNTER — Ambulatory Visit
Admission: EM | Admit: 2019-12-14 | Discharge: 2019-12-14 | Disposition: A | Discharge status: Home / Self Care | Payer: 59 | Attending: Emergency Medicine | Admitting: Emergency Medicine

## 2019-12-14 ENCOUNTER — Other Ambulatory Visit: Payer: Self-pay

## 2019-12-14 DIAGNOSIS — M5442 Lumbago with sciatica, left side: Secondary | ICD-10-CM | POA: Diagnosis not present

## 2019-12-14 DIAGNOSIS — G629 Polyneuropathy, unspecified: Secondary | ICD-10-CM

## 2019-12-14 MED ORDER — PREDNISONE 20 MG PO TABS: 20.0000 mg | ORAL_TABLET | Freq: Every day | ORAL | 0 refills | Status: AC

## 2019-12-14 NOTE — Discharge Instructions (Signed)
Continue conservative management of rest, ice, and gentle stretches Take prednisone as prescribed and to completion Follow up with PCP for further evaluation and management Return or go to the ER if you have any new or worsening symptoms (fever, chills, chest pain, abdominal pain, changes in bowel or bladder habits, pain radiating into lower legs, etc...)

## 2019-12-14 NOTE — ED Triage Notes (Signed)
Pt presents with c/o left leg pain radiating

## 2019-12-14 NOTE — ED Provider Notes (Signed)
[place]   BY:[id number] 12/14/19 Arrival Time: [id number]  CC: Back PAIN  SUBJECTIVE: History from: patient. Andrea Grant is a 59 y.o. female complains of low back pain and radiating symptoms into LT knee x few days.  Denies a precipitating event or specific injury.  Localizes the pain to the low back and LT knee.  Describes the pain as constant and burning in character.  Has tried OTC medications without relief.  Symptoms are made worse with movement.  Reports hx of herniated disc, but denies similar symptoms in the past.  Denies fever, chills, erythema, ecchymosis, effusion, weakness, saddle paresthesias, loss of bowel or bladder function.      Hx significant for neuropathy.  Takes gabapentin.    ROS: As per HPI.  All other pertinent ROS negative.     Past Medical History:  Diagnosis Date  . Anxiety disorder   . Carpal tunnel syndrome, right   . Cyst of skin   . Depression, episodic 07/27/2014  . Hypertensive heart disease 07/27/2014  . Insomnia   . Lumbar radiculopathy   . Non-smoker   . Obesity (BMI 30-39.9)   . Type 2 diabetes mellitus with peripheral neuropathy ([place]) 07/27/2014   Past Surgical History:  Procedure Laterality Date  . LEFT HEART CATHETERIZATION WITH CORONARY ANGIOGRAM N/A 07/29/2014   Procedure: LEFT HEART CATHETERIZATION WITH CORONARY ANGIOGRAM;  Surgeon: Sinclair Grooms, MD;  Location: [facility] CATH LAB;  Service: Cardiovascular;  Laterality: N/A;  . no surgical hx     Allergies  Allergen Reactions  . Percocet [Oxycodone-Acetaminophen] Itching    Per pt, tolerates Vicodin and APAP fine  . Penicillins Rash   No current facility-administered medications on file prior to encounter.   Current Outpatient Medications on File Prior to Encounter  Medication Sig Dispense Refill  . amLODipine (NORVASC) 10 MG tablet Take 1 tablet (10 mg total) by mouth daily. 30 tablet 12  . aspirin EC 81 MG tablet Take 81 mg by mouth daily.    Marland Kitchen atorvastatin (LIPITOR) 10  MG tablet Take 10 mg by mouth daily.    . calcium carbonate (OS-CAL) 600 MG TABS tablet Take 600 mg by mouth daily.    . cholecalciferol (VITAMIN D) 1000 UNITS tablet Take 1,000 Units by mouth daily.    . DULoxetine (CYMBALTA) 30 MG capsule Take 30 mg by mouth daily.    Marland Kitchen gabapentin (NEURONTIN) 300 MG capsule Take 300 mg by mouth 2 (two) times daily.    Marland Kitchen glimepiride (AMARYL) 4 MG tablet Take 4 mg by mouth daily.    . hydrALAZINE (APRESOLINE) 50 MG tablet Take 1.5 tablets (75 mg total) by mouth 2 (two) times daily. 270 tablet 1  . insulin aspart protamine - aspart (NOVOLOG MIX 70/30 FLEXPEN) (70-30) 100 UNIT/ML FlexPen Inject into the skin. 32 units in AM and 30 units in PM    . JARDIANCE 10 MG TABS tablet Take 10 mg by mouth daily.    Marland Kitchen labetalol (NORMODYNE) 300 MG tablet Take 1 tablet (300 mg total) by mouth 2 (two) times daily. 60 tablet 12  . LORazepam (ATIVAN) 1 MG tablet Take 1 mg by mouth at bedtime.    . metFORMIN (GLUCOPHAGE) 1000 MG tablet Take 1,000 mg by mouth 2 (two) times daily with a meal.    . nitroGLYCERIN (NITROSTAT) 0.4 MG SL tablet Place 1 tablet (0.4 mg total) under the tongue every 5 (five) minutes x 3 doses as needed for chest pain. 25 tablet 12  .  traMADol (ULTRAM) 50 MG tablet Take 50 mg by mouth every 6 (six) hours as needed.     Social History   Socioeconomic History  . Marital status: Married    Spouse name: Not on file  . Number of children: Not on file  . Years of education: Not on file  . Highest education level: Not on file  Occupational History  . Not on file  Tobacco Use  . Smoking status: Never Smoker  . Smokeless tobacco: Never Used  Substance and Sexual Activity  . Alcohol use: No    Alcohol/week: 0.0 standard drinks  . Drug use: No  . Sexual activity: Never    Birth control/protection: Abstinence  Other Topics Concern  . Not on file  Social History Narrative  . Not on file   Social Determinants of Health   Financial Resource Strain:   .  Difficulty of Paying Living Expenses:   Food Insecurity:   . Worried About Charity fundraiser in the Last Year:   . Arboriculturist in the Last Year:   Transportation Needs:   . Film/video editor (Medical):   Marland Kitchen Lack of Transportation (Non-Medical):   Physical Activity:   . Days of Exercise per Week:   . Minutes of Exercise per Session:   Stress:   . Feeling of Stress :   Social Connections:   . Frequency of Communication with Friends and Family:   . Frequency of Social Gatherings with Friends and Family:   . Attends Religious Services:   . Active Member of Clubs or Organizations:   . Attends Archivist Meetings:   Marland Kitchen Marital Status:   Intimate Partner Violence:   . Fear of Current or Ex-Partner:   . Emotionally Abused:   Marland Kitchen Physically Abused:   . Sexually Abused:    Family History  Problem Relation Age of Onset  . Cancer Mother   . Heart disease Mother   . CVA Father     OBJECTIVE:  Vitals:   12/14/19 1141  BP: (!) 176/109  Pulse: 82  Resp: 16  Temp: 99 F (37.2 C)  TempSrc: Oral  SpO2: 95%    General appearance: ALERT; in no acute distress.  Head: NCAT Lungs: Normal respiratory effort; CTAB CV: RRR Musculoskeletal:back Inspection: Skin warm, dry, clear and intact without obvious erythema, effusion, or ecchymosis.  Palpation: Nontender to palpation; reports burning to lateral lt knee ROM: FROM active and passive Strength: 5/5 shld abduction, 5/5 shld adduction, 5/5 elbow flexion, 5/5 elbow extension, 5/5 grip strength, 5/5 hip flexion, 5/5 hip extension Skin: warm and dry Neurologic: Ambulates without difficulty Psychological: alert and cooperative; normal mood and affect  ASSESSMENT & PLAN:  1. Acute bilateral low back pain with left-sided sciatica      Meds ordered this encounter  Medications  . predniSONE (DELTASONE) 20 MG tablet    Sig: Take 1 tablet (20 mg total) by mouth daily with breakfast for 5 days.    Dispense:  5 tablet     Refill:  0    Order Specific Question:   Supervising Provider    Answer:   Raylene Everts [id number]    Continue conservative management of rest, ice, and gentle stretches Take prednisone as prescribed and to completion Follow up with PCP for further evaluation and management Return or go to the ER if you have any new or worsening symptoms (fever, chills, chest pain, abdominal pain, changes in bowel or bladder habits, pain  radiating into lower legs, etc...)    Reviewed expectations re: course of current medical issues. Questions answered. Outlined signs and symptoms indicating need for more acute intervention. Patient verbalized understanding. After Visit Summary given.    Lestine Box, PA-C 12/14/19 1247

## 2019-12-26 ENCOUNTER — Telehealth: Payer: Self-pay | Admitting: *Deleted

## 2019-12-26 NOTE — Telephone Encounter (Signed)
Virtual visit made for 01/17/20 for osa. Referral from Dr Harl Bowie.

## 2019-12-26 NOTE — Telephone Encounter (Signed)
-----   Message from Sueanne Margarita, MD sent at 12/25/2019  6:18 PM EDT ----- Regarding: RE: REFERRAL TO DR. Fransico Him virtual visit or sleep study Virtual visit ----- Message ----- From: Freada Bergeron, CMA Sent: 12/25/2019   6:12 PM EDT To: Sueanne Margarita, MD Subject: FW: REFERRAL TO DR. Tressia Miners TURNER virtual vis#  OSA  - compliant CPAP - has not been followed in long time.  Thanks, Gae Bon    ----- Message ----- From: Delfino Lovett T Sent: 12/24/2019   3:19 PM EDT To: Freada Bergeron, CMA, Cv Div Sleep Studies Subject: REFERRAL TO DR. Fransico Him                   Patient is being referral to Dr. Radford Pax for sleep apnea,unspecified type from Dr. Carlyle Dolly.    Thank you ,   Vicky

## 2020-01-07 NOTE — Telephone Encounter (Signed)
Patient has 6/18 appt with turner

## 2020-01-17 ENCOUNTER — Telehealth: Payer: Self-pay

## 2020-01-17 ENCOUNTER — Telehealth: Payer: 59 | Admitting: Cardiology

## 2020-01-17 NOTE — Telephone Encounter (Signed)
Spoke with patient to get her ready for virtual appointment with Dr. Radford Pax. Patient states that she is not able to do a virtual visit this morning and she will call back to reschedule.

## 2020-01-17 NOTE — Progress Notes (Deleted)
Virtual Visit via Video Note   This visit type was conducted due to national recommendations for restrictions regarding the COVID-19 Pandemic (e.g. social distancing) in an effort to limit this patient's exposure and mitigate transmission in our community.  Due to her co-morbid illnesses, this patient is at least at moderate risk for complications without adequate follow up.  This format is felt to be most appropriate for this patient at this time.  All issues noted in this document were discussed and addressed.  A limited physical exam was performed with this format.  Please refer to the patient's chart for her consent to telehealth for [facility].  Evaluation Performed:  Follow-up visit  This visit type was conducted due to national recommendations for restrictions regarding the COVID-19 Pandemic (e.g. social distancing).  This format is felt to be most appropriate for this patient at this time.  All issues noted in this document were discussed and addressed.  No physical exam was performed (except for noted visual exam findings with Video Visits).  Please refer to the patient's chart (MyChart message for video visits and phone note for telephone visits) for the patient's consent to telehealth for [facility].  Date:  01/17/2020   ID:  Andrea Grant, DOB [birth year], MRN [id number]  Patient Location:  Home  Provider location:   [place]  PCP:  Glenda Chroman, MD  Cardiologist:  Carlyle Dolly, MD  Sleep Medicine:   Fransico Him, MD Electrophysiologist:  None   Chief Complaint:  OSA  History of Present Illness:    Andrea Grant is a 59 y.o. female who presents via audio/video conferencing for a telehealth visit today.    This is a 59yo female with a hx of OSA dx years ago.  I do not have any prior sleep study reports.  She had been on CPAP but has not seen a sleep physician in years and is now referred by Dr. Harl Bowie.     Prior CV studies:   The following studies were  reviewed today:  none  Past Medical History:  Diagnosis Date  . Anxiety disorder   . Carpal tunnel syndrome, right   . Cyst of skin   . Depression, episodic 07/27/2014  . Hypertensive heart disease 07/27/2014  . Insomnia   . Lumbar radiculopathy   . Non-smoker   . Obesity (BMI 30-39.9)   . Type 2 diabetes mellitus with peripheral neuropathy ([place]) 07/27/2014   Past Surgical History:  Procedure Laterality Date  . LEFT HEART CATHETERIZATION WITH CORONARY ANGIOGRAM N/A 07/29/2014   Procedure: LEFT HEART CATHETERIZATION WITH CORONARY ANGIOGRAM;  Surgeon: Sinclair Grooms, MD;  Location: [facility] CATH LAB;  Service: Cardiovascular;  Laterality: N/A;  . no surgical hx       No outpatient medications have been marked as taking for the 01/17/20 encounter (Appointment) with Sueanne Margarita, MD.     Allergies:   Percocet [oxycodone-acetaminophen] and Penicillins   Social History   Tobacco Use  . Smoking status: Never Smoker  . Smokeless tobacco: Never Used  Substance Use Topics  . Alcohol use: No    Alcohol/week: 0.0 standard drinks  . Drug use: No     Family Hx: The patient's family history includes CVA in her father; Cancer in her mother; Heart disease in her mother.  ROS:   Please see the history of present illness.     All other systems reviewed and are negative.   Labs/Other Tests and Data Reviewed:  Recent Labs: No results found for requested labs within last 8760 hours.   Recent Lipid Panel Lab Results  Component Value Date/Time   CHOL 258 (H) 07/28/2014 12:45 AM   TRIG 185 (H) 07/28/2014 12:45 AM   HDL 36 (L) 07/28/2014 12:45 AM   CHOLHDL 7.2 07/28/2014 12:45 AM   LDLCALC 185 (H) 07/28/2014 12:45 AM    Wt Readings from Last 3 Encounters:  12/03/19 220 lb 3.2 oz (99.9 kg)  12/14/16 210 lb (95.3 kg)  11/21/14 229 lb (103.9 kg)     Objective:    Vital Signs:  LMP  (LMP Unknown)    CONSTITUTIONAL:  Well nourished, well developed female in no acute  distress.  EYES: anicteric MOUTH: oral mucosa is pink RESPIRATORY: Normal respiratory effort, symmetric expansion CARDIOVASCULAR: No peripheral edema SKIN: No rash, lesions or ulcers MUSCULOSKELETAL: no digital cyanosis NEURO: Cranial Nerves II-XII grossly intact, moves all extremities PSYCH: Intact judgement and insight.  A&O x 3, Mood/affect appropriate   ASSESSMENT & PLAN:    1.  OSA -she carries a dx of OSA and has been on CPAP but has not been compliant  2.  HTN -continue amlodipine 10mg  daily  Time:   Today, I have spent 20 minutes on telemedicine discussing medical problems including OSA, HTN and reviewing patient's chart including OV notes from Dr. Harl Bowie.  Medication Adjustments/Labs and Tests Ordered: Current medicines are reviewed at length with the patient today.  Concerns regarding medicines are outlined above.  Tests Ordered: No orders of the defined types were placed in this encounter.  Medication Changes: No orders of the defined types were placed in this encounter.   Disposition:  Follow up prn  Signed, Fransico Him, MD  01/17/2020 7:27 AM    [place]

## 2020-01-20 ENCOUNTER — Encounter: Payer: Self-pay | Admitting: *Deleted

## 2020-01-21 ENCOUNTER — Encounter: Payer: Self-pay | Admitting: Diagnostic Neuroimaging

## 2020-01-21 ENCOUNTER — Other Ambulatory Visit: Payer: Self-pay

## 2020-01-21 ENCOUNTER — Ambulatory Visit (INDEPENDENT_AMBULATORY_CARE_PROVIDER_SITE_OTHER): Payer: 59 | Admitting: Diagnostic Neuroimaging

## 2020-01-21 VITALS — BP 192/91 | HR 84 | Ht 65.0 in | Wt 213.0 lb

## 2020-01-21 DIAGNOSIS — R202 Paresthesia of skin: Secondary | ICD-10-CM

## 2020-01-21 DIAGNOSIS — R200 Anesthesia of skin: Secondary | ICD-10-CM | POA: Diagnosis not present

## 2020-01-21 NOTE — Patient Instructions (Signed)
-   EMG/NCS (nerve testing)  - wrist splint at bedtime

## 2020-01-21 NOTE — Progress Notes (Signed)
[facility]  PATIENT: Andrea Grant DOB: 14-Apr-1961  REFERRING CLINICIAN: Glenda Chroman, MD HISTORY FROM: patient  REASON FOR VISIT: new consult    HISTORICAL  CHIEF COMPLAINT:  Chief Complaint  Patient presents with  . Numbness    rm 7 New Pt "my whole right hand is numb all they way up my arm"    HISTORY OF PRESENT ILLNESS:   59 year old female here for evaluation of right hand numbness and weakness.  Symptoms started in 2019 but have been progressively worse in the past 6 months.  She has numbness, pain, tingling in her right hand, mainly digits 1-4.  Pain also radiates up her arm into her right shoulder.  Symptoms wake her up at night.  Symptoms she hangs her arm over the bed to get relief.  She has noticed some atrophy of the base of her thumb on the right side.  No problems with left hand or feet or legs.  Patient has history of diabetes and hypertension on medication.  REVIEW OF SYSTEMS: Full 14 system review of systems performed and negative with exception of: As per HPI.  ALLERGIES: Allergies  Allergen Reactions  . Percocet [Oxycodone-Acetaminophen] Itching    Per pt, tolerates Vicodin and APAP fine  . Penicillins Rash    hives    HOME MEDICATIONS: Outpatient Medications Prior to Visit  Medication Sig Dispense Refill  . amLODipine (NORVASC) 10 MG tablet Take 1 tablet (10 mg total) by mouth daily. 30 tablet 12  . aspirin EC 81 MG tablet Take 81 mg by mouth daily.    Marland Kitchen atorvastatin (LIPITOR) 10 MG tablet Take 10 mg by mouth daily.    . calcium carbonate (OS-CAL) 600 MG TABS tablet Take 600 mg by mouth daily.    . cholecalciferol (VITAMIN D) 1000 UNITS tablet Take 1,000 Units by mouth daily.    . DULoxetine (CYMBALTA) 30 MG capsule Take 30 mg by mouth daily.    Marland Kitchen gabapentin (NEURONTIN) 300 MG capsule Take 300 mg by mouth 2 (two) times daily.    Marland Kitchen glimepiride (AMARYL) 4 MG tablet Take 4 mg by mouth daily.    . hydrALAZINE (APRESOLINE) 50 MG  tablet Take 1.5 tablets (75 mg total) by mouth 2 (two) times daily. 270 tablet 1  . insulin aspart protamine - aspart (NOVOLOG MIX 70/30 FLEXPEN) (70-30) 100 UNIT/ML FlexPen Inject into the skin. 32 units in AM and 30 units in PM    . JARDIANCE 10 MG TABS tablet Take 10 mg by mouth daily.    Marland Kitchen labetalol (NORMODYNE) 300 MG tablet Take 1 tablet (300 mg total) by mouth 2 (two) times daily. 60 tablet 12  . LORazepam (ATIVAN) 1 MG tablet Take 1 mg by mouth at bedtime.    . metFORMIN (GLUCOPHAGE) 1000 MG tablet Take 1,000 mg by mouth 2 (two) times daily with a meal.    . nitroGLYCERIN (NITROSTAT) 0.4 MG SL tablet Place 1 tablet (0.4 mg total) under the tongue every 5 (five) minutes x 3 doses as needed for chest pain. 25 tablet 12  . traMADol (ULTRAM) 50 MG tablet Take 50 mg by mouth every 6 (six) hours as needed.     No facility-administered medications prior to visit.    PAST MEDICAL HISTORY: Past Medical History:  Diagnosis Date  . Anxiety disorder   . Carpal tunnel syndrome, right   . Cyst of skin   . Depression   . Depression, episodic 07/27/2014  . Hypercholesteremia   .  Hypertensive heart disease 07/27/2014  . Insomnia   . Lumbar radiculopathy   . Non-smoker   . Obesity (BMI 30-39.9)   . Type 2 diabetes mellitus with peripheral neuropathy ([place]) 07/27/2014    PAST SURGICAL HISTORY: Past Surgical History:  Procedure Laterality Date  . LEFT HEART CATHETERIZATION WITH CORONARY ANGIOGRAM N/A 07/29/2014   Procedure: LEFT HEART CATHETERIZATION WITH CORONARY ANGIOGRAM;  Surgeon: Sinclair Grooms, MD;  Location: [facility] CATH LAB;  Service: Cardiovascular;  Laterality: N/A;  . no surgical hx      FAMILY HISTORY: Family History  Problem Relation Age of Onset  . Cancer Mother   . Heart disease Mother   . CVA Father     SOCIAL HISTORY: Social History   Socioeconomic History  . Marital status: Divorced    Spouse name: Not on file  . Number of children: 3  . Years of education: 35   . 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 Smoking status: Never Smoker  . Smokeless tobacco: Never Used  Substance and Sexual Activity  . Alcohol use: No    Alcohol/week: 0.0 standard drinks  . Drug use: No  . Sexual activity: Never    Birth control/protection: Abstinence  Other Topics Concern  . Not on file  Social History Narrative   Lives with 2 sons   Social Determinants of Health   Financial Resource Strain:   . Difficulty of Paying Living Expenses:   Food Insecurity:   . Worried About Charity fundraiser in the Last Year:   . Arboriculturist in the Last Year:   Transportation Needs:   . Film/video editor (Medical):   Marland Kitchen Lack of Transportation (Non-Medical):   Physical Activity:   . Days of Exercise per Week:   . Minutes of Exercise per Session:   Stress:   . Feeling of Stress :   Social Connections:   . Frequency of Communication with Friends and Family:   . Frequency of Social Gatherings with Friends and Family:   . Attends Religious Services:   . Active Member of Clubs or Organizations:   . Attends Archivist Meetings:   Marland Kitchen Marital Status:   Intimate Partner Violence:   . Fear of Current or Ex-Partner:   . Emotionally Abused:   Marland Kitchen Physically Abused:   . Sexually Abused:      PHYSICAL EXAM  GENERAL EXAM/CONSTITUTIONAL: Vitals:  Vitals:   01/21/20 1310  BP: (!) 192/91  Pulse: 84  Weight: 213 lb (96.6 kg)  Height: 5\' 5"  (1.651 m)     Body mass index is 35.45 kg/m. Wt Readings from Last 3 Encounters:  01/21/20 213 lb (96.6 kg)  12/03/19 220 lb 3.2 oz (99.9 kg)  12/14/16 210 lb (95.3 kg)     Patient is in no distress; well developed, nourished and groomed; neck is supple  CARDIOVASCULAR:  Examination of carotid arteries is normal; no carotid bruits  Regular rate and rhythm, no murmurs  Examination of peripheral vascular system by observation and palpation is  normal  EYES:  Ophthalmoscopic exam of optic discs and posterior segments is normal; no papilledema or hemorrhages  No exam data present  MUSCULOSKELETAL:  Gait, strength, tone, movements noted in Neurologic exam below  NEUROLOGIC: MENTAL STATUS:  No flowsheet data found.  awake, alert, oriented to person, place and time  recent and remote memory intact  normal attention and  concentration  language fluent, comprehension intact, naming intact  fund of knowledge appropriate  CRANIAL NERVE:   2nd - no papilledema on fundoscopic exam  2nd, 3rd, 4th, 6th - pupils equal and reactive to light, visual fields full to confrontation, extraocular muscles intact, no nystagmus  5th - facial sensation symmetric  7th - facial strength symmetric  8th - hearing intact  9th - palate elevates symmetrically, uvula midline  11th - shoulder shrug symmetric  12th - tongue protrusion midline  MOTOR:   normal bulk and tone, full strength in the BUE, BLE; EXCEPT ATROPHY AND WEAKNESS IN RIGHT APB  SENSORY:   normal and symmetric to light touch, pinprick, temperature, vibration; EXCEPT DECR PP IN RIGHT HAND DIGIT 1-2  BORDERLINE PHALEN'S IN RIGHT HAND  COORDINATION:   finger-nose-finger, fine finger movements normal  REFLEXES:   deep tendon reflexes TRACE and symmetric  GAIT/STATION:   narrow based gait     DIAGNOSTIC DATA (LABS, IMAGING, TESTING) - I reviewed patient records, labs, notes, testing and imaging myself where available.  Lab Results  Component Value Date   WBC 8.4 07/29/2014   HGB 12.4 07/29/2014   HCT 38.2 07/29/2014   MCV 91.2 07/29/2014   PLT 296 07/29/2014      Component Value Date/Time   NA 132 (L) 07/28/2014 0045   K 3.9 07/28/2014 0045   CL 100 07/28/2014 0045   CO2 23 07/28/2014 0045   GLUCOSE 343 (H) 07/28/2014 0045   BUN 20 07/28/2014 0045   CREATININE 0.73 07/29/2014 1847   CALCIUM 8.8 07/28/2014 0045   GFRNONAA >90 07/29/2014 1847    GFRAA >90 07/29/2014 1847   Lab Results  Component Value Date   CHOL 258 (H) 07/28/2014   HDL 36 (L) 07/28/2014   LDLCALC 185 (H) 07/28/2014   TRIG 185 (H) 07/28/2014   CHOLHDL 7.2 07/28/2014   Lab Results  Component Value Date   HGBA1C 9.6 (H) 07/27/2014   No results found for: [id number] Lab Results  Component Value Date   TSH 0.997 07/27/2014       ASSESSMENT AND PLAN  59 y.o. year old female here with right hand numbness, tingling, weakness, signs and symptoms consistent with right carpal tunnel syndrome.  We will proceed with further work-up.  Dx:  1. Numbness and tingling in right hand       PLAN:  - EMG/NCS - wrist splint at bedtime  Orders Placed This Encounter  Procedures  . NCV with EMG(electromyography)   Return for for NCV/EMG.    Penni Bombard, MD 03/30/9406, 6:80 PM Certified in Neurology, Neurophysiology and Neuroimaging  [facility] [address], [place], [place] [address] [phone]

## 2020-01-23 ENCOUNTER — Ambulatory Visit (INDEPENDENT_AMBULATORY_CARE_PROVIDER_SITE_OTHER): Payer: 59 | Admitting: Diagnostic Neuroimaging

## 2020-01-23 ENCOUNTER — Encounter (INDEPENDENT_AMBULATORY_CARE_PROVIDER_SITE_OTHER): Payer: 59 | Admitting: Diagnostic Neuroimaging

## 2020-01-23 DIAGNOSIS — R202 Paresthesia of skin: Secondary | ICD-10-CM

## 2020-01-23 DIAGNOSIS — R200 Anesthesia of skin: Secondary | ICD-10-CM | POA: Diagnosis not present

## 2020-01-23 DIAGNOSIS — Z0289 Encounter for other administrative examinations: Secondary | ICD-10-CM

## 2020-01-24 NOTE — Procedures (Signed)
[facility]  NCS (NERVE CONDUCTION STUDY) WITH EMG (ELECTROMYOGRAPHY) REPORT   STUDY DATE: 01/23/20 PATIENT NAME: Andrea Grant DOB: [birth year] MRN: [id number]  ORDERING CLINICIAN: Andrey Spearman, MD   TECHNOLOGIST: Sherre Scarlet ELECTROMYOGRAPHER: Earlean Polka. Nekeisha Aure, MD  CLINICAL INFORMATION: 59 year old female with right hand numbness and weakness.  FINDINGS: NERVE CONDUCTION STUDY:  Right median motor and sensory responses could not be obtained.  Left median motor responses prolonged distal latency  (6.8 ms) and slowed conduction velocity.  Left median sensory response could not be obtained.  Right ulnar sensory and motor responses are normal.  Right ulnar F latency slightly prolonged.    NEEDLE ELECTROMYOGRAPHY:  Needle examination of right upper extremity is normal.   IMPRESSION:   Abnormal study demonstrating: -Severe right and moderate to severe left median neuropathy at the wrist consistent with bilateral carpal tunnel syndrome.    INTERPRETING PHYSICIAN:  Penni Bombard, MD Certified in Neurology, Neurophysiology and Neuroimaging  [facility] [address] [place], [place] [address] [phone]   [facility]    Nerve / Sites Muscle Latency Ref. Amplitude Ref. Rel Amp Segments Distance Velocity Ref. Area    ms ms mV mV %  cm m/s m/s mVms  R Median - APB     Wrist APB NR ?4.4 NR ?4.0 NR Wrist - APB 7   NR     Upper arm APB NR  NR  NR Upper arm - Wrist 22 NR ?49 NR  L Median - APB     Wrist APB 6.8 ?4.4 1.0 ?4.0 100 Wrist - APB 7   5.3     Upper arm APB 13.8  3.1  316 Upper arm - Wrist 21 30 ?49 15.0     Ulnar Wrist APB 3.0  4.2  133 Ulnar Wrist - APB    8.2  R Ulnar - ADM     Wrist ADM 2.6 ?3.3 11.9 ?6.0 100 Wrist - ADM 7   36.6     B.Elbow ADM 7.1  11.2  94 B.Elbow - Wrist 22 49 ?49 35.9     A.Elbow ADM 9.2  10.5  93.4 A.Elbow - B.Elbow 10 49 ?49 34.9           SNC    Nerve / Sites Rec. Site  Peak Lat Ref.  Amp Ref. Segments Distance    ms ms V V  cm  R Median - Orthodromic (Dig II, Mid palm)     Dig II Wrist NR ?3.4 NR ?10 Dig II - Wrist 13  L Median - Orthodromic (Dig II, Mid palm)     Dig II Wrist NR ?3.4 NR ?10 Dig II - Wrist 13  R Ulnar - Orthodromic, (Dig V, Mid palm)     Dig V Wrist 2.5 ?3.1 8 ?5 Dig V - Wrist 39           F  Wave    Nerve F Lat Ref.   ms ms  R Ulnar - ADM 33.9 ?32.0       EMG Summary Table    Spontaneous MUAP Recruitment  Muscle IA Fib PSW Fasc Other Amp Dur. Poly Pattern  R. Deltoid Normal None None None _______ Normal Normal Normal Normal  R. Biceps brachii Normal None None None _______ Normal Normal Normal Normal  R. Triceps brachii Normal None None None _______ Normal Normal Normal Normal  R. Flexor carpi radialis Normal None None None _______ Normal Normal  Normal Normal  R. First dorsal interosseous Normal None None None _______ Normal Normal Normal Normal

## 2020-02-16 ENCOUNTER — Encounter: Payer: Self-pay | Admitting: Gastroenterology

## 2020-02-16 NOTE — Progress Notes (Deleted)
Referring Provider: Glenda Chroman, MD Primary Care Physician:  Glenda Chroman, MD Primary Gastroenterologist:  Dr. Rayne Du chief complaint on file.   HPI:   Andrea Grant is a 59 y.o. female presenting today at the request of Vyas, Dhruv B, MD for consult colonoscopy.  Recommended office visit due to medications.      Past Medical History:  Diagnosis Date  . Anxiety disorder   . Carpal tunnel syndrome, right   . Cyst of skin   . Depression   . Depression, episodic 07/27/2014  . Hypercholesteremia   . Hypertensive heart disease 07/27/2014  . Insomnia   . Lumbar radiculopathy   . Non-smoker   . Obesity (BMI 30-39.9)   . Type 2 diabetes mellitus with peripheral neuropathy ([place]) 07/27/2014    Past Surgical History:  Procedure Laterality Date  . LEFT HEART CATHETERIZATION WITH CORONARY ANGIOGRAM N/A 07/29/2014   Procedure: LEFT HEART CATHETERIZATION WITH CORONARY ANGIOGRAM;  Surgeon: Sinclair Grooms, MD;  Location: [facility] CATH LAB;  Service: Cardiovascular;  Laterality: N/A;  . no surgical hx      Current Outpatient Medications  Medication Sig Dispense Refill  . amLODipine (NORVASC) 10 MG tablet Take 1 tablet (10 mg total) by mouth daily. 30 tablet 12  . aspirin EC 81 MG tablet Take 81 mg by mouth daily.    Marland Kitchen atorvastatin (LIPITOR) 10 MG tablet Take 10 mg by mouth daily.    . calcium carbonate (OS-CAL) 600 MG TABS tablet Take 600 mg by mouth daily.    . cholecalciferol (VITAMIN D) 1000 UNITS tablet Take 1,000 Units by mouth daily.    . DULoxetine (CYMBALTA) 30 MG capsule Take 30 mg by mouth daily.    Marland Kitchen gabapentin (NEURONTIN) 300 MG capsule Take 300 mg by mouth 2 (two) times daily.    Marland Kitchen glimepiride (AMARYL) 4 MG tablet Take 4 mg by mouth daily.    . hydrALAZINE (APRESOLINE) 50 MG tablet Take 1.5 tablets (75 mg total) by mouth 2 (two) times daily. 270 tablet 1  . insulin aspart protamine - aspart (NOVOLOG MIX 70/30 FLEXPEN) (70-30) 100 UNIT/ML FlexPen Inject into the skin.  32 units in AM and 30 units in PM    . JARDIANCE 10 MG TABS tablet Take 10 mg by mouth daily.    Marland Kitchen labetalol (NORMODYNE) 300 MG tablet Take 1 tablet (300 mg total) by mouth 2 (two) times daily. 60 tablet 12  . LORazepam (ATIVAN) 1 MG tablet Take 1 mg by mouth at bedtime.    . metFORMIN (GLUCOPHAGE) 1000 MG tablet Take 1,000 mg by mouth 2 (two) times daily with a meal.    . nitroGLYCERIN (NITROSTAT) 0.4 MG SL tablet Place 1 tablet (0.4 mg total) under the tongue every 5 (five) minutes x 3 doses as needed for chest pain. 25 tablet 12  . traMADol (ULTRAM) 50 MG tablet Take 50 mg by mouth every 6 (six) hours as needed.     No current facility-administered medications for this visit.    Allergies as of 02/17/2020 - Review Complete 01/21/2020  Allergen Reaction Noted  . Percocet [oxycodone-acetaminophen] Itching 07/27/2014  . Penicillins Rash 07/27/2014    Family History  Problem Relation Age of Onset  . Cancer Mother   . Heart disease Mother   . CVA Father     Social History   Socioeconomic History  . Marital status: Divorced    Spouse name: Not on file  . Number of children:  3  . Years of education: 13  . 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 Smoking status: Never Smoker  . Smokeless tobacco: Never Used  Substance and Sexual Activity  . Alcohol use: No    Alcohol/week: 0.0 standard drinks  . Drug use: No  . Sexual activity: Never    Birth control/protection: Abstinence  Other Topics Concern  . Not on file  Social History Narrative   Lives with 2 sons   Social Determinants of Health   Financial Resource Strain:   . Difficulty of Paying Living Expenses:   Food Insecurity:   . Worried About Charity fundraiser in the Last Year:   . Arboriculturist in the Last Year:   Transportation Needs:   . Film/video editor (Medical):   Marland Kitchen Lack of Transportation (Non-Medical):   Physical Activity:   . Days of  Exercise per Week:   . Minutes of Exercise per Session:   Stress:   . Feeling of Stress :   Social Connections:   . Frequency of Communication with Friends and Family:   . Frequency of Social Gatherings with Friends and Family:   . Attends Religious Services:   . Active Member of Clubs or Organizations:   . Attends Archivist Meetings:   Marland Kitchen Marital Status:   Intimate Partner Violence:   . Fear of Current or Ex-Partner:   . Emotionally Abused:   Marland Kitchen Physically Abused:   . Sexually Abused:     Review of Systems: Gen: Denies any fever, chills, fatigue, weight loss, lack of appetite.  CV: Denies chest pain, heart palpitations, peripheral edema, syncope.  Resp: Denies shortness of breath at rest or with exertion. Denies wheezing or cough.  GI: Denies dysphagia or odynophagia. Denies jaundice, hematemesis, fecal incontinence. GU : Denies urinary burning, urinary frequency, urinary hesitancy MS: Denies joint pain, muscle weakness, cramps, or limitation of movement.  Derm: Denies rash, itching, dry skin Psych: Denies depression, anxiety, memory loss, and confusion Heme: Denies bruising, bleeding, and enlarged lymph nodes.  Physical Exam: LMP  (LMP Unknown)  General:   Alert and oriented. Pleasant and cooperative. Well-nourished and well-developed.  Head:  Normocephalic and atraumatic. Eyes:  Without icterus, sclera clear and conjunctiva pink.  Ears:  Normal auditory acuity. Nose:  No deformity, discharge,  or lesions. Mouth:  No deformity or lesions, oral mucosa pink.  Neck:  Supple, without mass or thyromegaly. Lungs:  Clear to auscultation bilaterally. No wheezes, rales, or rhonchi. No distress.  Heart:  S1, S2 present without murmurs appreciated.  Abdomen:  +BS, soft, non-tender and non-distended. No HSM noted. No guarding or rebound. No masses appreciated.  Rectal:  Deferred  Msk:  Symmetrical without gross deformities. Normal posture. Pulses:  Normal pulses  noted. Extremities:  Without clubbing or edema. Neurologic:  Alert and  oriented x4;  grossly normal neurologically. Skin:  Intact without significant lesions or rashes. Cervical Nodes:  No significant cervical adenopathy. Psych:  Alert and cooperative. Normal mood and affect.

## 2020-02-17 ENCOUNTER — Ambulatory Visit: Payer: 59 | Admitting: Gastroenterology

## 2020-02-17 ENCOUNTER — Encounter: Payer: Self-pay | Admitting: Internal Medicine

## 2020-02-19 NOTE — Progress Notes (Signed)
Referring Provider: Glenda Chroman, MD Primary Care Physician:  Glenda Chroman, MD Primary Gastroenterologist:  Dr. Abbey Chatters  Chief Complaint  Patient presents with  . Consult    TCS never done prior    HPI:   Andrea Grant is a 59 y.o. female presenting today at the request of Vyas, Dhruv B, MD for consult colonoscopy.   No prior colonoscopy. No abdominal pain. BMs daily. Stools are loose on metformin at times. No blood in the stool. Reports completing a stool test sometime within the last year that was positive. Per notes sent from Dr. Woody Seller, looks like stool test was completed in November 2020, unclear of results. No black stool. No unintentional weight loss. No nausea or vomiting. Has GERD when eating spicy foods. Takes Prilosec OTC as needed. No dysphagia.   Last Hemoglobin A1C was 9 in March 2021.     Past Medical History:  Diagnosis Date  . Anxiety disorder   . CAD (coronary artery disease)    Cath 07/2014 with nonobstructive CAD mild to moderate, echo 07/2014 LVEF 60 to 65%.  . Carpal tunnel syndrome, right   . Cyst of skin   . Depression   . Depression, episodic 07/27/2014  . Hypercholesteremia   . Hypertensive heart disease 07/27/2014  . Insomnia   . Lumbar radiculopathy   . Non-smoker   . Obesity (BMI 30-39.9)   . OSA (obstructive sleep apnea)    using CPAP sometimes  . Type 2 diabetes mellitus with peripheral neuropathy ([place]) 07/27/2014    Past Surgical History:  Procedure Laterality Date  . LEFT HEART CATHETERIZATION WITH CORONARY ANGIOGRAM N/A 07/29/2014   Procedure: LEFT HEART CATHETERIZATION WITH CORONARY ANGIOGRAM;  Surgeon: Sinclair Grooms, MD;  Location: [facility] CATH LAB;  Service: Cardiovascular;  Laterality: N/A;    Current Outpatient Medications  Medication Sig Dispense Refill  . amLODipine (NORVASC) 10 MG tablet Take 1 tablet (10 mg total) by mouth daily. 30 tablet 12  . aspirin EC 81 MG tablet Take 81 mg by mouth daily.    Marland Kitchen atorvastatin (LIPITOR)  10 MG tablet Take 10 mg by mouth daily.    . busPIRone (BUSPAR) 10 MG tablet Take 10 mg by mouth 3 (three) times daily.    . calcium carbonate (OS-CAL) 600 MG TABS tablet Take 600 mg by mouth daily.    . cholecalciferol (VITAMIN D) 1000 UNITS tablet Take 1,000 Units by mouth daily.    . DULoxetine (CYMBALTA) 30 MG capsule Take 30 mg by mouth daily.    Marland Kitchen gabapentin (NEURONTIN) 300 MG capsule Take 300 mg by mouth 2 (two) times daily.    Marland Kitchen glimepiride (AMARYL) 4 MG tablet Take 4 mg by mouth in the morning and at bedtime.     . hydrALAZINE (APRESOLINE) 50 MG tablet Take 1.5 tablets (75 mg total) by mouth 2 (two) times daily. 270 tablet 1  . insulin aspart protamine - aspart (NOVOLOG MIX 70/30 FLEXPEN) (70-30) 100 UNIT/ML FlexPen Inject into the skin. 32 units in AM and 30 units in PM    . JARDIANCE 10 MG TABS tablet Take 10 mg by mouth daily.    Marland Kitchen labetalol (NORMODYNE) 300 MG tablet Take 1 tablet (300 mg total) by mouth 2 (two) times daily. 60 tablet 12  . LORazepam (ATIVAN) 1 MG tablet Take 1 mg by mouth at bedtime.    . metFORMIN (GLUCOPHAGE) 1000 MG tablet Take 1,000 mg by mouth 2 (two) times daily with a meal.    .  traMADol (ULTRAM) 50 MG tablet Take 50 mg by mouth every 6 (six) hours as needed.     No current facility-administered medications for this visit.    Allergies as of 02/20/2020 - Review Complete 02/20/2020  Allergen Reaction Noted  . Percocet [oxycodone-acetaminophen] Itching 07/27/2014  . Penicillins Rash 07/27/2014    Family History  Problem Relation Age of Onset  . Cancer Mother   . Heart disease Mother   . CVA Father   . Colon cancer Neg Hx     Social History   Socioeconomic History  . Marital status: Divorced    Spouse name: Not on file  . Number of children: 3  . Years of education: 73  . 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 Smoking status: Never Smoker  . Smokeless tobacco: Never Used    Substance and Sexual Activity  . Alcohol use: No    Alcohol/week: 0.0 standard drinks  . Drug use: No  . Sexual activity: Never    Birth control/protection: Abstinence  Other Topics Concern  . Not on file  Social History Narrative   Lives with 2 sons   Social Determinants of Health   Financial Resource Strain:   . Difficulty of Paying Living Expenses:   Food Insecurity:   . Worried About Charity fundraiser in the Last Year:   . Arboriculturist in the Last Year:   Transportation Needs:   . Film/video editor (Medical):   Marland Kitchen Lack of Transportation (Non-Medical):   Physical Activity:   . Days of Exercise per Week:   . Minutes of Exercise per Session:   Stress:   . Feeling of Stress :   Social Connections:   . Frequency of Communication with Friends and Family:   . Frequency of Social Gatherings with Friends and Family:   . Attends Religious Services:   . Active Member of Clubs or Organizations:   . Attends Archivist Meetings:   Marland Kitchen Marital Status:   Intimate Partner Violence:   . Fear of Current or Ex-Partner:   . Emotionally Abused:   Marland Kitchen Physically Abused:   . Sexually Abused:     Review of Systems: Gen: Denies any fever, chills, cold or flulike symptoms, lightheadedness, dizziness, presyncope, syncope. CV: Denies chest pain or heart palpitations. Resp: Denies shortness of breath or cough. GI: See HPI GU : Denies urinary burning, urinary frequency, urinary hesitancy MS: Has carpal tunnel in right wrist and bulging disk in her back causing back and left leg pain.   Derm: Denies rash Psych: Admits to depression/ anxiety Heme: See HPI  Physical Exam: BP (!) 141/76   Pulse 78   Temp (!) 97.5 F (36.4 C)   Ht 5\' 5"  (1.651 m)   Wt 211 lb 9.6 oz (96 kg)   LMP  (LMP Unknown)   BMI 35.21 kg/m  General:   Alert and oriented. Pleasant and cooperative. Well-nourished and well-developed.  Head:  Normocephalic and atraumatic. Eyes:  Without icterus, sclera  clear and conjunctiva pink.  Ears:  Normal auditory acuity. Lungs:  Clear to auscultation bilaterally. No wheezes, rales, or rhonchi. No distress.  Heart:  S1, S2 present without murmurs appreciated.  Abdomen:  +BS, soft, non-tender and non-distended. No HSM noted. No guarding or rebound. No masses appreciated.  Rectal:  Deferred  Msk:  Symmetrical without gross deformities. Normal posture. Extremities:  Without edema. Neurologic:  Alert and  oriented x4;  grossly normal neurologically. Skin:  Intact without significant lesions or rashes. Psych:  Normal mood and affect.

## 2020-02-20 ENCOUNTER — Encounter: Payer: Self-pay | Admitting: *Deleted

## 2020-02-20 ENCOUNTER — Telehealth: Payer: Self-pay | Admitting: Gastroenterology

## 2020-02-20 ENCOUNTER — Encounter: Payer: Self-pay | Admitting: Gastroenterology

## 2020-02-20 ENCOUNTER — Ambulatory Visit (INDEPENDENT_AMBULATORY_CARE_PROVIDER_SITE_OTHER): Payer: 59 | Admitting: Gastroenterology

## 2020-02-20 ENCOUNTER — Encounter: Payer: Self-pay | Admitting: Orthopaedic Surgery

## 2020-02-20 ENCOUNTER — Other Ambulatory Visit: Payer: Self-pay

## 2020-02-20 ENCOUNTER — Ambulatory Visit (INDEPENDENT_AMBULATORY_CARE_PROVIDER_SITE_OTHER): Payer: 59 | Admitting: Orthopaedic Surgery

## 2020-02-20 DIAGNOSIS — M545 Low back pain, unspecified: Secondary | ICD-10-CM

## 2020-02-20 DIAGNOSIS — Z1211 Encounter for screening for malignant neoplasm of colon: Secondary | ICD-10-CM | POA: Diagnosis not present

## 2020-02-20 DIAGNOSIS — M546 Pain in thoracic spine: Secondary | ICD-10-CM

## 2020-02-20 NOTE — Patient Instructions (Addendum)
We will get you scheduled for colonoscopy in the near future with Dr. Abbey Chatters 1 day prior to procedure: Take one half dose of diabetes medications including: 1/2 tablet (2 mg) glimepiride in the morning and evening, 1/2 tablet (5 mg) Jardiance in the morning and evening, 1/2 tablet (500 mg) Metformin in the morning and evening, one half dose of insulin in the morning (16 units) and evening (15 units).  Day of your procedure: Do not take morning diabetes medications.  We will see back as Dr. Abbey Chatters recommends at the time of colonoscopy.  Aliene Altes, PA-C [facility]

## 2020-02-20 NOTE — Telephone Encounter (Signed)
Andrea Grant, can we request fecal occult blood test results from PCP?

## 2020-02-20 NOTE — Assessment & Plan Note (Addendum)
59 year old female with history of not adequately controlled type 2 diabetes, OSA, obesity, HTN, HLD, nonobstructive CAD, anxiety/depression presenting to schedule first ever colonoscopy.  No significant upper or lower GI symptoms.  Denies bright red blood per rectum, melena, or unintentional weight loss.  Reports completing a stool test sometime within the last year that she thinks was positive.  Looks like stool test was completed in November but will need to request results.  Plan: Proceed with colonoscopy with propofol with Dr. Abbey Chatters in the near future. The risks, benefits, and alternatives have been discussed in detail with patient. They have stated understanding and desire to proceed.  ASA III See separate instructions for diabetes medication adjustments. Request fecal occult blood test from PCP. Follow-up as recommended at the time of colonoscopy.   Addendum:  Received Fecal occult blood test from PCP. Report date is 12/03/19 with positive result.

## 2020-02-24 DIAGNOSIS — M546 Pain in thoracic spine: Secondary | ICD-10-CM | POA: Insufficient documentation

## 2020-02-24 DIAGNOSIS — M545 Low back pain, unspecified: Secondary | ICD-10-CM | POA: Insufficient documentation
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
## 2020-02-25 NOTE — Telephone Encounter (Signed)
Requested from PCP °

## 2020-03-01 ENCOUNTER — Telehealth: Payer: Self-pay | Admitting: Gastroenterology

## 2020-03-01 NOTE — Telephone Encounter (Signed)
Received Fecal occult blood test from PCP. Report date is 12/03/19 with positive result.   [facility] Clinical Pool: I saw patient for colon cancer screening on 7/22. Patient had reported having stool test within the last year. I did not have results at that time. Now that I have the positive FOBT result, does the indication for colonoscopy need to be changed for insurance purposes? Wasn't sure if there was anything that you all needed to do prior to procedure.

## 2020-03-02 NOTE — Telephone Encounter (Signed)
Noted. Diagnoses changed in order

## 2020-03-02 NOTE — Telephone Encounter (Signed)
Almyra Free please advise if you are able to assist in [facility] question? Thanks

## 2020-03-02 NOTE — Telephone Encounter (Signed)
I spoke with our coder please change to positive IFOBT for reason for her tcs

## 2020-03-26 NOTE — Patient Instructions (Signed)
Your procedure is scheduled on: 03/31/2020  Report to [facility] at 8:30    AM.  Call this number if you have problems the morning of surgery: [phone]   Remember:              Follow Directions on the letter you received from Your Physician's office regarding the Bowel Prep              No Smoking the day of Procedure :   Take these medicines the morning of surgery with A SIP OF WATER: Amlodipine, hydralazine, Buspar,and Cymbalta   (gabapentin, robaxin and tramadol if needed)              No diabetic medication am of procedure              Take only 1/2 dose of 70/30 insulin 15 units the night before procedure and hold the morning of procedure   Do not wear jewelry, make-up or nail polish.    Do not bring valuables to the hospital.  Contacts, dentures or bridgework may not be worn into surgery.  .   Patients discharged the day of surgery will not be allowed to drive home.     Colonoscopy, Adult, Care After This sheet gives you information about how to care for yourself after your procedure. Your health care provider may also give you more specific instructions. If you have problems or questions, contact your health care provider. What can I expect after the procedure? After the procedure, it is common to have:  A small amount of blood in your stool for 24 hours after the procedure.  Some gas.  Mild abdominal cramping or bloating.  Follow these instructions at home: General instructions   For the first 24 hours after the procedure: ? Do not drive or use machinery. ? Do not sign important documents. ? Do not drink alcohol. ? Do your regular daily activities at a slower pace than normal. ? Eat soft, easy-to-digest foods. ? Rest often.  Take over-the-counter or prescription medicines only as told by your health care provider.  It is up to you to get the results of your procedure. Ask your health care provider, or the department performing the procedure, when your  results will be ready. Relieving cramping and bloating  Try walking around when you have cramps or feel bloated.  Apply heat to your abdomen as told by your health care provider. Use a heat source that your health care provider recommends, such as a moist heat pack or a heating pad. ? Place a towel between your skin and the heat source. ? Leave the heat on for 20-30 minutes. ? Remove the heat if your skin turns bright red. This is especially important if you are unable to feel pain, heat, or cold. You may have a greater risk of getting burned. Eating and drinking  Drink enough fluid to keep your urine clear or pale yellow.  Resume your normal diet as instructed by your health care provider. Avoid heavy or fried foods that are hard to digest.  Avoid drinking alcohol for as long as instructed by your health care provider. Contact a health care provider if:  You have blood in your stool 2-3 days after the procedure. Get help right away if:  You have more than a small spotting of blood in your stool.  You pass large blood clots in your stool.  Your abdomen is swollen.  You have nausea or vomiting.  You  have a fever.  You have increasing abdominal pain that is not relieved with medicine. This information is not intended to replace advice given to you by your health care provider. Make sure you discuss any questions you have with your health care provider. Document Released: 03/01/2004 Document Revised: 04/11/2016 Document Reviewed: 09/29/2015 Elsevier Interactive Patient Education  [employer].

## 2020-03-30 ENCOUNTER — Encounter (HOSPITAL_COMMUNITY)
Admission: RE | Admit: 2020-03-30 | Discharge: 2020-03-30 | Disposition: A | Discharge status: Home / Self Care | Payer: 59 | Source: Ambulatory Visit | Attending: Internal Medicine | Admitting: Internal Medicine

## 2020-03-30 ENCOUNTER — Encounter (HOSPITAL_COMMUNITY): Payer: Self-pay

## 2020-03-30 ENCOUNTER — Other Ambulatory Visit: Payer: Self-pay

## 2020-03-30 ENCOUNTER — Other Ambulatory Visit (HOSPITAL_COMMUNITY)
Admission: RE | Admit: 2020-03-30 | Discharge: 2020-03-30 | Disposition: A | Discharge status: Home / Self Care | Payer: 59 | Source: Ambulatory Visit | Attending: Internal Medicine | Admitting: Internal Medicine

## 2020-03-30 DIAGNOSIS — Z01812 Encounter for preprocedural laboratory examination: Secondary | ICD-10-CM | POA: Insufficient documentation

## 2020-03-30 DIAGNOSIS — Z20822 Contact with and (suspected) exposure to covid-19: Secondary | ICD-10-CM | POA: Insufficient documentation

## 2020-03-30 LAB — BASIC METABOLIC PANEL
Anion gap: 10 (ref 5–15)
BUN: 14 mg/dL (ref 6–20)
CO2: 21 mmol/L — ABNORMAL LOW (ref 22–32)
Calcium: 9 mg/dL (ref 8.9–10.3)
Chloride: 105 mmol/L (ref 98–111)
Creatinine, Ser: 1.04 mg/dL — ABNORMAL HIGH (ref 0.44–1.00)
GFR calc Af Amer: >60 mL/min (ref >60)
GFR calc non Af Amer: 60 mL/min — ABNORMAL LOW (ref >60)
Glucose, Bld: 61 mg/dL — ABNORMAL LOW (ref 70–99)
Potassium: 4 mmol/L (ref 3.5–5.1)
Sodium: 136 mmol/L (ref 135–145)

## 2020-03-30 LAB — SARS CORONAVIRUS 2 (TAT 6-24 HRS): SARS Coronavirus 2: NEGATIVE

## 2020-03-31 ENCOUNTER — Ambulatory Visit (HOSPITAL_COMMUNITY): Payer: 59 | Admitting: Anesthesiology

## 2020-03-31 ENCOUNTER — Ambulatory Visit (HOSPITAL_COMMUNITY)
Admission: RE | Admit: 2020-03-31 | Discharge: 2020-03-31 | Disposition: A | Discharge status: Home / Self Care | Payer: 59 | Source: Ambulatory Visit | Attending: Internal Medicine | Admitting: Internal Medicine

## 2020-03-31 ENCOUNTER — Encounter (HOSPITAL_COMMUNITY)
Admission: RE | Disposition: A | Discharge status: Home / Self Care | Payer: Self-pay | Source: Ambulatory Visit | Attending: Internal Medicine

## 2020-03-31 ENCOUNTER — Encounter (HOSPITAL_COMMUNITY): Payer: Self-pay | Admitting: Internal Medicine

## 2020-03-31 DIAGNOSIS — Z79899 Other long term (current) drug therapy: Secondary | ICD-10-CM | POA: Insufficient documentation

## 2020-03-31 DIAGNOSIS — E669 Obesity, unspecified: Secondary | ICD-10-CM | POA: Insufficient documentation

## 2020-03-31 DIAGNOSIS — Z880 Allergy status to penicillin: Secondary | ICD-10-CM | POA: Insufficient documentation

## 2020-03-31 DIAGNOSIS — I2510 Atherosclerotic heart disease of native coronary artery without angina pectoris: Secondary | ICD-10-CM | POA: Insufficient documentation

## 2020-03-31 DIAGNOSIS — D124 Benign neoplasm of descending colon: Secondary | ICD-10-CM | POA: Insufficient documentation

## 2020-03-31 DIAGNOSIS — R195 Other fecal abnormalities: Secondary | ICD-10-CM | POA: Diagnosis present

## 2020-03-31 DIAGNOSIS — E1142 Type 2 diabetes mellitus with diabetic polyneuropathy: Secondary | ICD-10-CM | POA: Diagnosis not present

## 2020-03-31 DIAGNOSIS — Z8249 Family history of ischemic heart disease and other diseases of the circulatory system: Secondary | ICD-10-CM | POA: Insufficient documentation

## 2020-03-31 DIAGNOSIS — I119 Hypertensive heart disease without heart failure: Secondary | ICD-10-CM | POA: Diagnosis not present

## 2020-03-31 DIAGNOSIS — K635 Polyp of colon: Secondary | ICD-10-CM | POA: Diagnosis not present

## 2020-03-31 DIAGNOSIS — F329 Major depressive disorder, single episode, unspecified: Secondary | ICD-10-CM | POA: Insufficient documentation

## 2020-03-31 DIAGNOSIS — F419 Anxiety disorder, unspecified: Secondary | ICD-10-CM | POA: Insufficient documentation

## 2020-03-31 DIAGNOSIS — Z794 Long term (current) use of insulin: Secondary | ICD-10-CM | POA: Insufficient documentation

## 2020-03-31 DIAGNOSIS — E7800 Pure hypercholesterolemia, unspecified: Secondary | ICD-10-CM | POA: Insufficient documentation

## 2020-03-31 DIAGNOSIS — Z885 Allergy status to narcotic agent status: Secondary | ICD-10-CM | POA: Insufficient documentation

## 2020-03-31 DIAGNOSIS — G4733 Obstructive sleep apnea (adult) (pediatric): Secondary | ICD-10-CM | POA: Insufficient documentation

## 2020-03-31 HISTORY — PX: COLONOSCOPY WITH PROPOFOL: SHX5780

## 2020-03-31 HISTORY — PX: POLYPECTOMY: SHX5525

## 2020-03-31 LAB — GLUCOSE, CAPILLARY
Glucose-Capillary: 91 mg/dL (ref 70–99)
Glucose-Capillary: 78 mg/dL (ref 70–99)

## 2020-03-31 SURGERY — COLONOSCOPY WITH PROPOFOL
Anesthesia: General

## 2020-03-31 MED ORDER — CHLORHEXIDINE GLUCONATE CLOTH 2 % EX PADS: 6.0000 | MEDICATED_PAD | Freq: Once | CUTANEOUS | Status: DC

## 2020-03-31 MED ORDER — LACTATED RINGERS IV SOLN: Freq: Once | INTRAVENOUS | Status: AC

## 2020-03-31 MED ORDER — LACTATED RINGERS IV SOLN: INTRAVENOUS | Status: DC | PRN

## 2020-03-31 MED ORDER — STERILE WATER FOR IRRIGATION IR SOLN
Status: DC | PRN
  Administered 2020-03-31: 1.5 mL

## 2020-03-31 MED ORDER — LIDOCAINE HCL (CARDIAC) PF 50 MG/5ML IV SOSY
PREFILLED_SYRINGE | INTRAVENOUS | Status: DC | PRN
  Administered 2020-03-31: 80 mg via INTRAVENOUS

## 2020-03-31 MED ORDER — PROPOFOL 500 MG/50ML IV EMUL
INTRAVENOUS | Status: DC | PRN
  Administered 2020-03-31: 100 mg via INTRAVENOUS
  Administered 2020-03-31: 40 mg via INTRAVENOUS
  Administered 2020-03-31: 150 ug/kg/min via INTRAVENOUS

## 2020-03-31 NOTE — Op Note (Signed)
Andrea Grant Patient Name: Andrea Grant Procedure Date: 03/31/2020 8:49 AM MRN: [id number] Date of Birth: Oct 06, 1960 Attending MD: Andrea Grant , MD CSN: [id number] Age: 59 Admit Type: Outpatient Procedure:                Colonoscopy Indications:              Heme positive stool Providers:                Andrea Richards, MD, Andrea Riggers, RN, Andrea Grant, Andrea Grant, Technician, Andrea Grant Referring MD:              Medicines:                Propofol per Anesthesia Complications:            No immediate complications. Estimated Blood Loss:     Estimated blood loss was minimal. Procedure:                Pre-Anesthesia Assessment:                           - Prior to the procedure, a History and Physical                            was performed, and patient medications and                            allergies were reviewed. The patient's tolerance of                            previous anesthesia was also reviewed. The risks                            and benefits of the procedure and the sedation                            options and risks were discussed with the patient.                            All questions were answered, and informed consent                            was obtained. Prior Anticoagulants: The patient has                            taken no previous anticoagulant or antiplatelet                            agents. ASA Grade Assessment: II - A patient with  mild systemic disease. After reviewing the risks                            and benefits, the patient was deemed in                            satisfactory condition to undergo the procedure.                           After obtaining informed consent, the colonoscope                            was passed under direct vision. Throughout the                            procedure, the patient's blood  pressure, pulse, and                            oxygen saturations were monitored continuously. The                            CF-[id number] ([id number]) scope was introduced through                            the anus and advanced to the the cecum, identified                            by appendiceal orifice and ileocecal valve. The                            colonoscopy was performed without difficulty. The                            patient tolerated the procedure well. The quality                            of the bowel preparation was adequate. Scope In: 9:08:42 AM Scope Out: 9:27:22 AM Scope Withdrawal Time: 0 hours 10 minutes 56 seconds  Total Procedure Duration: 0 hours 18 minutes 40 seconds  Findings:      The perianal and digital rectal examinations were normal.      Two sessile polyps were found in the descending colon. The polyps were 5       to 6 mm in size. These polyps were removed with a cold snare. Resection       and retrieval were complete. Estimated blood loss was minimal.      The exam was otherwise without abnormality on direct and retroflexion       views. Impression:               - Two 5 to 6 mm polyps in the descending colon,                            removed with a cold snare. Resected and retrieved.                           -  The examination was otherwise normal on direct                            and retroflexion views. Moderate Sedation:      Moderate (conscious) sedation was personally administered by an       anesthesia professional. The following parameters were monitored: oxygen       saturation, heart rate, blood pressure, respiratory rate, EKG, adequacy       of pulmonary ventilation, and response to care. Recommendation:           - Patient has a contact number available for                            emergencies. The signs and symptoms of potential                            delayed complications were discussed with the                             patient. Return to normal activities tomorrow.                            Written discharge instructions were provided to the                            patient.                           - Resume previous diet.                           - Continue present medications.                           - Repeat colonoscopy date to be determined after                            pending pathology results are reviewed for                            surveillance.                           - Return to GI office (date not yet determined). Procedure Code(s):        --- Professional ---                           [phone], Colonoscopy, flexible; with removal of                            tumor(s), polyp(s), or other lesion(s) by snare                            technique Diagnosis Code(s):        --- Professional ---  K63.5, Polyp of colon                           R19.5, Other fecal abnormalities CPT copyright 2019 American Medical Association. All rights reserved. The codes documented in this report are preliminary and upon coder review may  be revised to meet current compliance requirements. Andrea Grant. Andrea Monje, MD Andrea Richards, MD 03/31/2020 9:35:35 AM This report has been signed electronically. Number of Addenda: 0

## 2020-03-31 NOTE — Discharge Instructions (Signed)
Colonoscopy Discharge Instructions  Read the instructions outlined below and refer to this sheet in the next few weeks. These discharge instructions provide you with general information on caring for yourself after you leave the hospital. Your doctor may also give you specific instructions. While your treatment has been planned according to the most current medical practices available, unavoidable complications occasionally occur. If you have any problems or questions after discharge, call Dr. Gala Romney at [phone]. ACTIVITY  You may resume your regular activity, but move at a slower pace for the next 24 hours.   Take frequent rest periods for the next 24 hours.   Walking will help get rid of the air and reduce the bloated feeling in your belly (abdomen).   No driving for 24 hours (because of the medicine (anesthesia) used during the test).    Do not sign any important legal documents or operate any machinery for 24 hours (because of the anesthesia used during the test).  NUTRITION  Drink plenty of fluids.   You may resume your normal diet as instructed by your doctor.   Begin with a light meal and progress to your normal diet. Heavy or fried foods are harder to digest and may make you feel sick to your stomach (nauseated).   Avoid alcoholic beverages for 24 hours or as instructed.  MEDICATIONS  You may resume your normal medications unless your doctor tells you otherwise.  WHAT YOU CAN EXPECT TODAY  Some feelings of bloating in the abdomen.   Passage of more gas than usual.   Spotting of blood in your stool or on the toilet paper.  IF YOU HAD POLYPS REMOVED DURING THE COLONOSCOPY:  No aspirin products for 7 days or as instructed.   No alcohol for 7 days or as instructed.   Eat a soft diet for the next 24 hours.  FINDING OUT THE RESULTS OF YOUR TEST Not all test results are available during your visit. If your test results are not back during the visit, make an appointment  with your caregiver to find out the results. Do not assume everything is normal if you have not heard from your caregiver or the medical facility. It is important for you to follow up on all of your test results.  SEEK IMMEDIATE MEDICAL ATTENTION IF:  You have more than a spotting of blood in your stool.   Your belly is swollen (abdominal distention).   You are nauseated or vomiting.   You have a temperature over 101.   You have abdominal pain or discomfort that is severe or gets worse throughout the day.   2 polyps removed your colon today  Further recommendations to follow pending review of pathology report  At patient request I called Chauncy Lean at [phone] -reviewed results   Monitored Anesthesia Care, Care After These instructions provide you with information about caring for yourself after your procedure. Your health care provider may also give you more specific instructions. Your treatment has been planned according to current medical practices, but problems sometimes occur. Call your health care provider if you have any problems or questions after your procedure. What can I expect after the procedure? After your procedure, you may:  Feel sleepy for several hours.  Feel clumsy and have poor balance for several hours.  Feel forgetful about what happened after the procedure.  Have poor judgment for several hours.  Feel nauseous or vomit.  Have a sore throat if you had a breathing tube during the procedure.  Follow these instructions at home: For at least 24 hours after the procedure:      Have a responsible adult stay with you. It is important to have someone help care for you until you are awake and alert.  Rest as needed.  Do not: ? Participate in activities in which you could fall or become injured. ? Drive. ? Use heavy machinery. ? Drink alcohol. ? Take sleeping pills or medicines that cause drowsiness. ? Make important decisions or sign legal  documents. ? Take care of children on your own. Eating and drinking  Follow the diet that is recommended by your health care provider.  If you vomit, drink water, juice, or soup when you can drink without vomiting.  Make sure you have little or no nausea before eating solid foods. General instructions  Take over-the-counter and prescription medicines only as told by your health care provider.  If you have sleep apnea, surgery and certain medicines can increase your risk for breathing problems. Follow instructions from your health care provider about wearing your sleep device: ? Anytime you are sleeping, including during daytime naps. ? While taking prescription pain medicines, sleeping medicines, or medicines that make you drowsy.  If you smoke, do not smoke without supervision.  Keep all follow-up visits as told by your health care provider. This is important. Contact a health care provider if:  You keep feeling nauseous or you keep vomiting.  You feel light-headed.  You develop a rash.  You have a fever. Get help right away if:  You have trouble breathing. Summary  For several hours after your procedure, you may feel sleepy and have poor judgment.  Have a responsible adult stay with you for at least 24 hours or until you are awake and alert. This information is not intended to replace advice given to you by your health care provider. Make sure you discuss any questions you have with your health care provider. Document Revised: 10/16/2017 Document Reviewed: 11/08/2015 Elsevier Patient Education  [employer].

## 2020-03-31 NOTE — Transfer of Care (Signed)
Immediate Anesthesia Transfer of Care Note  Patient: Andrea Grant  Procedure(s) Performed: COLONOSCOPY WITH PROPOFOL (N/A ) POLYPECTOMY  Patient Location: PACU  Anesthesia Type:General  Level of Consciousness: awake, alert , oriented and patient cooperative  Airway & Oxygen Therapy: Patient Spontanous Breathing and Patient connected to nasal cannula oxygen  Post-op Assessment: Report given to RN, Post -op Vital signs reviewed and stable and Patient moving all extremities  Post vital signs: Reviewed and stable  Last Vitals:  Vitals Value Taken Time  BP    Temp    Pulse 70 03/31/20 0930  Resp 12 03/31/20 0930  SpO2 89 % 03/31/20 0930  Vitals shown include unvalidated device data.  Last Pain:  Vitals:   03/31/20 0903  TempSrc:   PainSc: 0-No pain      Patients Stated Pain Goal: 5 (08/22/22 1146)  Complications: No complications documented.

## 2020-03-31 NOTE — Anesthesia Preprocedure Evaluation (Addendum)
Anesthesia Evaluation  Patient identified by MRN, date of birth, ID band Patient awake    Reviewed: Allergy & Precautions, NPO status , Patient's Chart, lab work & pertinent test results  History of Anesthesia Complications Negative for: history of anesthetic complications  Airway Mallampati: II  TM Distance: >3 FB Neck ROM: Full    Dental  (+) Dental Advisory Given   Pulmonary sleep apnea and Continuous Positive Airway Pressure Ventilation ,    Pulmonary exam normal breath sounds clear to auscultation       Cardiovascular Exercise Tolerance: Good hypertension, Pt. on medications + CAD  Normal cardiovascular exam Rhythm:Regular Rate:Normal     Neuro/Psych PSYCHIATRIC DISORDERS Anxiety Depression  Neuromuscular disease    GI/Hepatic negative GI ROS, Neg liver ROS,   Endo/Other  diabetes, Well Controlled, Type 2, Oral Hypoglycemic Agents  Renal/GU negative Renal ROS  negative genitourinary   Musculoskeletal  (+) Arthritis  (back pain),   Abdominal   Peds  Hematology negative hematology ROS (+)   Anesthesia Other Findings Right eye retinal detachment sx.   Reproductive/Obstetrics negative OB ROS                            Anesthesia Physical Anesthesia Plan  ASA: III  Anesthesia Plan: General   Post-op Pain Management:    Induction: Intravenous  PONV Risk Score and Plan: TIVA  Airway Management Planned: Nasal Cannula and Natural Airway  Additional Equipment:   Intra-op Plan:   Post-operative Plan:   Informed Consent: I have reviewed the patients History and Physical, chart, labs and discussed the procedure including the risks, benefits and alternatives for the proposed anesthesia with the patient or authorized representative who has indicated his/her understanding and acceptance.     Dental advisory given  Plan Discussed with: CRNA and Surgeon  Anesthesia Plan Comments:          Anesthesia Quick Evaluation

## 2020-03-31 NOTE — H&P (Addendum)
@LOGO @   Primary Care Physician:  Glenda Chroman, MD Primary Gastroenterologist:  Dr. Gala Romney  Pre-Procedure History & Physical: HPI:  Andrea Grant is a 59 y.o. female here for further evaluation of Hemoccult positive stool.  No GI bleeding clinically.  No prior colonoscopy.  No lower GI symptoms. Past Medical History:  Diagnosis Date  . Anxiety disorder   . CAD (coronary artery disease)    Cath 07/2014 with nonobstructive CAD mild to moderate, echo 07/2014 LVEF 60 to 65%.  . Carpal tunnel syndrome, right   . Cyst of skin   . Depression   . Depression, episodic 07/27/2014  . Hypercholesteremia   . Hypertensive heart disease 07/27/2014  . Insomnia   . Lumbar radiculopathy   . Non-smoker   . Obesity (BMI 30-39.9)   . OSA (obstructive sleep apnea)    using CPAP sometimes  . Type 2 diabetes mellitus with peripheral neuropathy ([place]) 07/27/2014    Past Surgical History:  Procedure Laterality Date  . LEFT HEART CATHETERIZATION WITH CORONARY ANGIOGRAM N/A 07/29/2014   Procedure: LEFT HEART CATHETERIZATION WITH CORONARY ANGIOGRAM;  Surgeon: Sinclair Grooms, MD;  Location[facility] C[facility] LAB;  Service: Cardiovascular;  Laterality: N/A;    Prior to Admission medications   Medication Sig Start Date End Date Taking? Authorizing Provider  amLODipine (NORVASC) 10 MG tablet Take 1 tablet (10 mg total) by mouth daily. 07/30/14  Yes Jacolyn Reedy, MD  atorvastatin (LIPITOR) 10 MG tablet Take 10 mg by mouth daily. 10/20/19  Yes [provider]  busPIRone (BUSPAR) 10 MG tablet Take 10 mg by mouth 3 (three) times daily.   Yes [provider]  DULoxetine (CYMBALTA) 30 MG capsule Take 30 mg by mouth daily.   Yes [provider]  fexofenadine (ALLEGRA) 180 MG tablet Take 180 mg by mouth daily as needed for allergies or rhinitis.   Yes [provider]  gabapentin (NEURONTIN) 300 MG capsule Take 300 mg by mouth 2 (two) times daily.   Yes [provider]   glimepiride (AMARYL) 4 MG tablet Take 4 mg by mouth in the morning and at bedtime.    Yes [provider]  hydrALAZINE (APRESOLINE) 50 MG tablet Take 1.5 tablets (75 mg total) by mouth 2 (two) times daily. Patient taking differently: Take 50 mg by mouth 2 (two) times daily.  12/03/19  Yes Branch, Alphonse Guild, MD  insulin aspart protamine - aspart (NOVOLOG MIX 70/30 FLEXPEN) (70-30) 100 UNIT/ML FlexPen Inject 30-32 Units into the skin See admin instructions. Inject 32 units into the skin in the morning and 30 units in the evening   Yes [provider]  JARDIANCE 10 MG TABS tablet Take 10 mg by mouth daily. 12/02/19  Yes [provider]  labetalol (NORMODYNE) 200 MG tablet Take 200 mg by mouth 2 (two) times daily. 02/17/20  Yes [provider]  LORazepam (ATIVAN) 1 MG tablet Take 1 mg by mouth at bedtime as needed for anxiety.    Yes [provider]  metFORMIN (GLUCOPHAGE) 1000 MG tablet Take 1,000 mg by mouth 2 (two) times daily with a meal.   Yes [provider]  olmesartan (BENICAR) 20 MG tablet Take 20 mg by mouth daily.   Yes [provider]  oxyCODONE (OXY IR/ROXICODONE) 5 MG immediate release tablet Take 5 mg by mouth every 6 (six) hours as needed for severe pain. 03/05/20  Yes [provider]  prednisoLONE acetate (PRED FORTE) 1 % ophthalmic suspension  Place 1 drop into the right eye in the morning and at bedtime. 02/04/20  Yes [provider]  predniSONE (DELTASONE) 5 MG tablet Take 5 mg by mouth daily as needed (leg pain).   Yes [provider]  aspirin EC 81 MG tablet Take 81 mg by mouth daily. Patient not taking: Reported on 03/27/2020    [provider]  calcium carbonate (OS-CAL) 600 MG TABS tablet Take 600 mg by mouth daily. Patient not taking: Reported on 03/27/2020    [provider]  cholecalciferol (VITAMIN D) 1000 UNITS tablet Take 1,000 Units by mouth daily. Patient not taking:  Reported on 03/27/2020    [provider]  labetalol (NORMODYNE) 300 MG tablet Take 1 tablet (300 mg total) by mouth 2 (two) times daily. Patient not taking: Reported on 03/27/2020 07/30/14   Jacolyn Reedy, MD  methocarbamol (ROBAXIN) 500 MG tablet Take 500 mg by mouth 2 (two) times daily. Patient not taking: Reported on 03/27/2020 02/06/20   [provider]  traMADol (ULTRAM) 50 MG tablet Take 50 mg by mouth every 6 (six) hours as needed. Patient not taking: Reported on 03/27/2020 12/02/19   [provider]    Allergies as of 02/20/2020 - Review Complete 02/20/2020  Allergen Reaction Noted  . Percocet [oxycodone-acetaminophen] Itching 07/27/2014  . Penicillins Rash 07/27/2014    Family History  Problem Relation Age of Onset  . Cancer Mother   . Heart disease Mother   . CVA Father   . Colon cancer Neg Hx     Social History   Socioeconomic History  . Marital status: Divorced    Spouse name: Not on file  . Number of children: 3  . Years of education: 40  . 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 Smoking status: Never Smoker  . Smokeless tobacco: Never Used  Substance and Sexual Activity  . Alcohol use: No    Alcohol/week: 0.0 standard drinks  . Drug use: No  . Sexual activity: Never    Birth control/protection: Abstinence  Other Topics Concern  . Not on file  Social History Narrative   Lives with 2 sons   Social Determinants of Health   Financial Resource Strain:   . Difficulty of Paying Living Expenses: Not on file  Food Insecurity:   . Worried About Charity fundraiser in the Last Year: Not on file  . Ran Out of Food in the Last Year: Not on file  Transportation Needs:   . Lack of Transportation (Medical): Not on file  . Lack of Transportation (Non-Medical): Not on file  Physical Activity:   . Days of Exercise per Week: Not on file  . Minutes of Exercise per Session: Not on  file  Stress:   . Feeling of Stress : Not on file  Social Connections:   . Frequency of Communication with Friends and Family: Not on file  . Frequency of Social Gatherings with Friends and Family: Not on file  . Attends Religious Services: Not on file  . Active Member of Clubs or Organizations: Not on file  . Attends Archivist Meetings: Not on file  . Marital Status: Not on file  Intimate Partner Violence:   . Fear of Current or Ex-Partner: Not on file  . Emotionally Abused: Not on file  . Physically Abused: Not on file  . Sexually Abused: Not on file    Review  of Systems: See HPI, otherwise negative ROS  Physical Exam: BP 139/73   Pulse 65   Temp 98.1 F (36.7 C) (Oral)   Resp 18   LMP  (LMP Unknown)   SpO2 97%  General:   Alert,  Well-developed, well-nourished, pleasant and cooperative in NAD kles, or rhonchi. No acute distress. Heart:  Regular rate and rhythm; no murmurs, clicks, rubs,  or gallops. Abdomen:  Soft, nontender and nondistended. No masses, hepatosplenomegaly or hernias noted. Normal bowel sounds, without guarding, and without rebound.   Impression/Plan: Andrea Grant is now here to undergo a diagnostic colonoscopy for Hemoccult positive stool.  No prior colonoscopy. Risks, benefits, limitations, imponderables and alternatives regarding colonoscopy have been reviewed with the patient. Questions have been answered. All parties agreeable.     Notice:  This dictation was prepared with Dragon dictation along with smaller phrase technology. Any transcriptional errors that result from this process are unintentional and may not be corrected upon review.

## 2020-03-31 NOTE — Anesthesia Postprocedure Evaluation (Signed)
Anesthesia Post Note  Patient: Andrea Grant  Procedure(s) Performed: COLONOSCOPY WITH PROPOFOL (N/A ) POLYPECTOMY  Patient location during evaluation: PACU Anesthesia Type: General Level of consciousness: awake, oriented, awake and alert and patient cooperative Pain management: pain level controlled Vital Signs Assessment: post-procedure vital signs reviewed and stable Respiratory status: spontaneous breathing, respiratory function stable, nonlabored ventilation and patient connected to nasal cannula oxygen Cardiovascular status: blood pressure returned to baseline and stable Postop Assessment: no headache and no backache Anesthetic complications: no   No complications documented.   Last Vitals:  Vitals:   03/31/20 0829  BP: 139/73  Pulse: 65  Resp: 18  Temp: 36.7 C  SpO2: 97%    Last Pain:  Vitals:   03/31/20 0903  TempSrc:   PainSc: 0-No pain                 Tacy Learn

## 2020-04-01 ENCOUNTER — Encounter (HOSPITAL_COMMUNITY): Payer: Self-pay | Admitting: Internal Medicine

## 2020-04-01 ENCOUNTER — Encounter: Payer: Self-pay | Admitting: Internal Medicine

## 2020-04-01 LAB — SURGICAL PATHOLOGY

## 2020-04-02 ENCOUNTER — Ambulatory Visit: Payer: 59 | Admitting: Orthopaedic Surgery

## 2020-04-02 ENCOUNTER — Other Ambulatory Visit: Payer: Self-pay

## 2020-04-24 ENCOUNTER — Telehealth: Payer: Self-pay | Admitting: Orthopaedic Surgery

## 2020-04-24 NOTE — Telephone Encounter (Signed)
Billing 02/06/20-present faxed to [facility]. Auth on file fax [phone]

## 2020-11-23 DIAGNOSIS — Z0271 Encounter for disability determination: Secondary | ICD-10-CM

## 2021-01-04 ENCOUNTER — Other Ambulatory Visit: Payer: Self-pay | Admitting: Internal Medicine

## 2021-01-04 ENCOUNTER — Ambulatory Visit
Admission: RE | Admit: 2021-01-04 | Discharge: 2021-01-04 | Disposition: A | Discharge status: Home / Self Care | Payer: 59 | Source: Ambulatory Visit | Attending: Internal Medicine | Admitting: Internal Medicine

## 2021-01-04 ENCOUNTER — Other Ambulatory Visit: Payer: Self-pay

## 2021-01-04 DIAGNOSIS — Z139 Encounter for screening, unspecified: Secondary | ICD-10-CM

## 2021-01-05 ENCOUNTER — Ambulatory Visit (INDEPENDENT_AMBULATORY_CARE_PROVIDER_SITE_OTHER): Payer: 59 | Admitting: Family Medicine

## 2021-01-05 ENCOUNTER — Encounter: Payer: Self-pay | Admitting: Family Medicine

## 2021-01-05 VITALS — BP 116/62 | HR 90 | Ht 64.0 in | Wt 219.8 lb

## 2021-01-05 DIAGNOSIS — I2510 Atherosclerotic heart disease of native coronary artery without angina pectoris: Secondary | ICD-10-CM

## 2021-01-05 DIAGNOSIS — I4891 Unspecified atrial fibrillation: Secondary | ICD-10-CM | POA: Diagnosis not present

## 2021-01-05 DIAGNOSIS — G4733 Obstructive sleep apnea (adult) (pediatric): Secondary | ICD-10-CM

## 2021-01-05 DIAGNOSIS — I10 Essential (primary) hypertension: Secondary | ICD-10-CM | POA: Diagnosis not present

## 2021-01-05 DIAGNOSIS — E782 Mixed hyperlipidemia: Secondary | ICD-10-CM

## 2021-01-05 MED ORDER — METOPROLOL TARTRATE 25 MG PO TABS: 12.5000 mg | ORAL_TABLET | Freq: Two times a day (BID) | ORAL | 6 refills | Status: DC

## 2021-01-05 NOTE — Progress Notes (Addendum)
Cardiology Office Note  Date: 01/05/2021   ID: Andrea Grant, DOB 06/02/1961, MRN [id number]  PCP:  Andrea Chroman, Andrea Grant  Cardiologist:  Andrea Dolly, Andrea Grant Electrophysiologist:  None   Chief Complaint: Follow-up  History of Present Illness: Andrea Grant is a 60 y.o. female with a history of CAD, HTN, sleep apnea, HLD OSA, DM2, chest pain . She was last seen by Andrea Grant 12/03/2019.  She had no recent significant chest pains.  She is compliant with her antihypertensive medications.  She was seeing her PCP for her diabetes.  She was compliant with her statin medications and labs and follow-up PCP.  She compliant with her CPAP.  She had mild to moderate CAD on recent cardiac catheterization without symptoms.  She was continuing current medications.  Blood pressure was above goal and hydralazine was increased to 75 mg p.o. twice daily.  She was continuing statin medications and labs were requested from PCP.  Had her OSA had not been evaluated in several years.  She was referred to Dr. Radford Grant for evaluation.  She presents today with a recent 1 day history of palpitations/irregular heart rhythm with associated shortness of breath.  She states she has had some brief episodes in the past but nothing that persisted.  EKG today on arrival shows atrial fibrillation with RVR rate of 126.  She has a history of obstructive sleep apnea using CPAP.  She states she is having some issues with the machine and the apparatus she uses.  She would like to be referred for reevaluation of her OSA.  She denies any anginal or exertional symptoms, orthostatic symptoms, CVA or TIA-like symptoms.  He has been having issues with her right eye with retinal detachment and subsequent surgeries.  She states she  has had 3 retinal surgeries with the most recent being 2 weeks ago.  She sees Andrea Grant in [place] who is a retinal specialist.  She denies any PND orthopnea history.  Denies any bleeding issues.  Denies any  claudication-like symptoms, DVT or PE-like symptoms.  Blood pressure is well controlled on current therapy.  Current cardiac regimen includes amlodipine 10 mg daily, atorvastatin 10 mg daily, hydralazine 25 mg p.o. twice daily, labetalol 200 mg p.o. twice daily, olmesartan 20 mg daily.   Past Medical History:  Diagnosis Date  . Anxiety disorder   . CAD (coronary artery disease)    Cath 07/2014 with nonobstructive CAD mild to moderate, echo 07/2014 LVEF 60 to 65%.  . Carpal tunnel syndrome, right   . Cyst of skin   . Depression   . Depression, episodic 07/27/2014  . Hypercholesteremia   . Hypertensive heart disease 07/27/2014  . Insomnia   . Lumbar radiculopathy   . Non-smoker   . Obesity (BMI 30-39.9)   . OSA (obstructive sleep apnea)    using CPAP sometimes  . Type 2 diabetes mellitus with peripheral neuropathy ([place]) 07/27/2014    Past Surgical History:  Procedure Laterality Date  . COLONOSCOPY WITH PROPOFOL N/A 03/31/2020   Procedure: COLONOSCOPY WITH PROPOFOL;  Surgeon: Andrea Dolin, Andrea Grant;  [facility];  Service: Endoscopy;  Laterality: N/A;  10:00am, pt can't come earlier, transportation  . LEFT HEART CATHETERIZATION WITH CORONARY ANGIOGRAM N/A 07/29/2014   Procedure: LEFT HEART CATHETERIZATION WITH CORONARY ANGIOGRAM;  Surgeon: Andrea Grooms, Andrea Grant;  Location: [facility] CATH LAB;  Service: Cardiovascular;  Laterality: N/A;  . POLYPECTOMY  03/31/2020   Procedure: POLYPECTOMY;  Surgeon: Andrea Rudd  Grant, Andrea Grant;  [facility];  Service: Endoscopy;;    Current Outpatient Medications  Medication Sig Dispense Refill  . amLODipine (NORVASC) 10 MG tablet Take 1 tablet (10 mg total) by mouth daily. 30 tablet 12  . atorvastatin (LIPITOR) 10 MG tablet Take 10 mg by mouth daily.    . busPIRone (BUSPAR) 10 MG tablet Take 10 mg by mouth 3 (three) times daily.    . DULoxetine (CYMBALTA) 30 MG capsule Take 30 mg by mouth daily.    . fexofenadine (ALLEGRA) 180 MG tablet Take  180 mg by mouth daily as needed for allergies or rhinitis.    Marland Kitchen gabapentin (NEURONTIN) 300 MG capsule Take 300 mg by mouth 2 (two) times daily.    Marland Kitchen glimepiride (AMARYL) 4 MG tablet Take 4 mg by mouth in the morning and at bedtime.     . hydrALAZINE (APRESOLINE) 50 MG tablet Take 1.5 tablets (75 mg total) by mouth 2 (two) times daily. (Patient taking differently: Take 50 mg by mouth 2 (two) times daily.) 270 tablet 1  . insulin aspart protamine - aspart (NOVOLOG MIX 70/30 FLEXPEN) (70-30) 100 UNIT/ML FlexPen Inject 30-32 Units into the skin See admin instructions. Inject 32 units into the skin in the morning and 30 units in the evening    . JARDIANCE 10 MG TABS tablet Take 10 mg by mouth daily.    Marland Kitchen labetalol (NORMODYNE) 200 MG tablet Take 200 mg by mouth 2 (two) times daily.    Marland Kitchen LORazepam (ATIVAN) 1 MG tablet Take 1 mg by mouth at bedtime as needed for anxiety.     . metFORMIN (GLUCOPHAGE) 1000 MG tablet Take 1,000 mg by mouth 2 (two) times daily with a meal.    . metoprolol tartrate (LOPRESSOR) 25 MG tablet Take 0.5 tablets (12.5 mg total) by mouth 2 (two) times daily. 30 tablet 6  . olmesartan (BENICAR) 20 MG tablet Take 20 mg by mouth daily.    Marland Kitchen oxyCODONE (OXY IR/ROXICODONE) 5 MG immediate release tablet Take 5 mg by mouth every 6 (six) hours as needed for severe pain.    . prednisoLONE acetate (PRED FORTE) 1 % ophthalmic suspension Place 1 drop into the right eye in the morning and at bedtime.     No current facility-administered medications for this visit.   Allergies:  Percocet [oxycodone-acetaminophen] and Penicillins   Social History: The patient  reports that she has never smoked. She has never used smokeless tobacco. She reports that she does not drink alcohol and does not use drugs.   Family History: The patient's family history includes CVA in her father; Cancer in her mother; Heart disease in her mother.   ROS:  Please see the history of present illness. Otherwise, complete  review of systems is positive for none.  All other systems are reviewed and negative.   Physical Exam: VS:  BP 116/62   Pulse 90   Ht 5\' 4"  (1.626 Grant)   Wt 219 lb 12.8 oz (99.7 kg)   LMP  (LMP Unknown)   SpO2 97%   BMI 37.73 kg/Grant , BMI Body mass index is 37.73 kg/Grant.  Wt Readings from Last 3 Encounters:  01/05/21 219 lb 12.8 oz (99.7 kg)  03/30/20 211 lb (95.7 kg)  02/20/20 (!) 211 lb (95.7 kg)    General: Obese patient appears comfortable at rest. Neck: Supple, no elevated JVP or carotid bruits, no thyromegaly. Lungs: Clear to auscultation, nonlabored breathing at rest. Cardiac: Irregularly irregular tachycardic  rhythm, no S3 or significant systolic murmur, no pericardial rub. Extremities: No pitting edema, distal pulses 2+. Skin: Warm and dry. Musculoskeletal: No kyphosis. Neuropsychiatric: Alert and oriented x3, affect grossly appropriate.  ECG:  An ECG dated 01/05/2021 was personally reviewed today and demonstrated:  Atrial fibrillation with RVR rate of 126, septal infarct age undetermined  Recent Labwork: 03/30/2020: BUN 14; Creatinine, Ser 1.04; Potassium 4.0; Sodium 136     Component Value Date/Time   CHOL 258 (H) 07/28/2014 0045   TRIG 185 (H) 07/28/2014 0045   HDL 36 (L) 07/28/2014 0045   CHOLHDL 7.2 07/28/2014 0045   VLDL 37 07/28/2014 0045   LDLCALC 185 (H) 07/28/2014 0045    Other Studies Reviewed Today:   07/2014 Echo Study Conclusions  - Left ventricle: The cavity size was normal. Wall thickness was increased in a pattern of moderate LVH. Systolic function was normal. The estimated ejection fraction was in the range of 60% to 65%. Wall motion was normal; there were no regional wall motion abnormalities. - Left atrium: The atrium was mildly dilated. - Pericardium, extracardiac: A trivial pericardial effusion was identified.    07/2014 Cath HEMODYNAMICS: Aortic pressure 162/93 mmHg; LV pressure 163/23 mmHg; LVEDP 27 mmHg    ANGIOGRAPHIC DATA: The left main coronary artery is normal.  The left anterior descending artery is large and wraps around the left ventricular apex. There is mid vessel eccentric 30% narrowing and distal vessel eccentric 60-70% narrowing. The large first diagonal contains focal mid vessel 50% narrowing..  The left circumflex artery is patent. 2 marginal branches arise from the circumflex. He can marginal is a dominant of the 2 vessels. The proximal obtuse marginal contains a 50% narrowing..  The right coronary artery is dominant and widely patent.   LEFT VENTRICULOGRAM: Left ventricular angiogram was done in the 30 RAO projection and revealed brisk symmetric contractility with EF 60%. Elevated end-diastolic pressures noted.    IMPRESSIONS: 1. Nonobstructive coronary artery disease involving the left anterior descending, the first diagonal, and the first obtuse marginal branch.  2. Normal left ventricular systolic function with EF of at least 60%, left ventricular hypertrophy, and evidence of diastolic heart failure (LVEDP greater than 23 mmHg )  POST CATH RECOMMENDATION:   1. Aggressive management of hypertension  2. Eligible for discharge when blood pressure is controlled.     Assessment and Plan:  1. New onset atrial fibrillation ([place])   2. CAD in native artery   3. Essential hypertension   4. Mixed hyperlipidemia   5. OSA (obstructive sleep apnea)     1. New onset atrial fibrillation [facility]) Patient noticed recent increase in rapid heart rhythm/palpitations yesterday which has persisted.  States she has some shortness of breath associated.  EKG on arrival today shows atrial fibrillation with RVR rate of 126.  CHA2DS2-VASc score 3.  (HTN, DM, gender).  Start metoprolol 12.5 mg p.o. twice daily.  Start Eliquis 5 mg p.o. twice daily.  I spoke to the assistant of Dr. Baird Cancer ophthalmologist it [place] who was deferring starting anticoagulation  to [place].  I have been asked to fax the note to Dr. Iona Hansen for his records.   2. CAD in native artery Denies any anginal or exertional symptoms.  Currently not on aspirin.  We have started Eliquis for new onset atrial fibrillation.  3. Essential hypertension Blood pressure currently well controlled on current therapy.  Blood pressure today 116/62.  Continue amlodipine 10 mg daily, hydralazine 75 mg p.o. twice daily,  labetalol 200 mg p.o. twice daily.  Olmesartan 20 mg daily.  4. Mixed hyperlipidemia Continue atorvastatin 10 mg daily.  5. OSA (obstructive sleep apnea) Patient states she would like to be reevaluated secondary to her sleep apnea.  She states she does not believe her CPAP machine is working well and she is not tolerating to current mask apparatus.  Please refer to Dr. Radford Grant for reevaluation for OSA.   Medication Adjustments/Labs and Tests Ordered: Current medicines are reviewed at length with the patient today.  Concerns regarding medicines are outlined above.   Disposition: Follow-up with Andrea Grant or APP 1 month  Signed, Andrea July, NP 01/05/2021 1:18 PM    [facility] at [place], [place], [place] [address] Phone: [phone]; Fax: [phone]

## 2021-01-05 NOTE — Patient Instructions (Addendum)
Medication Instructions:   Begin Lopressor 12.5mg  twice a day.  Continue all other medications.    Labwork: none  Testing/Procedures: none  Follow-Up: 1 week   Any Other Special Instructions Will Be Listed Below (If Applicable).  If you need a refill on your cardiac medications before your next appointment, please call your pharmacy.

## 2021-01-06 ENCOUNTER — Other Ambulatory Visit: Payer: Self-pay | Admitting: *Deleted

## 2021-01-06 MED ORDER — ELIQUIS 5 MG PO TABS: 5.0000 mg | ORAL_TABLET | Freq: Two times a day (BID) | ORAL | 0 refills | Status: DC

## 2021-01-06 MED ORDER — ELIQUIS 5 MG PO TABS: 5.0000 mg | ORAL_TABLET | Freq: Two times a day (BID) | ORAL | 6 refills | Status: DC

## 2021-01-10 NOTE — Progress Notes (Deleted)
Cardiology Office Note  Date: 01/10/2021   ID: Andrea Grant, DOB [birth year], MRN [id number]  PCP:  Glenda Chroman, MD  Cardiologist:  Carlyle Dolly, MD Electrophysiologist:  None   Chief Complaint: Follow-up  History of Present Illness: Andrea Grant is a 60 y.o. female with a history of CAD, HTN, sleep apnea, HLD OSA, DM2, chest pain . She was last seen by Dr. Harl Bowie 12/03/2019.  She had no recent significant chest pains.  She is compliant with her antihypertensive medications.  She was seeing her PCP for her diabetes.  She was compliant with her statin medications and labs and follow-up PCP.  She compliant with her CPAP.  She had mild to moderate CAD on recent cardiac catheterization without symptoms.  She was continuing current medications.  Blood pressure was above goal and hydralazine was increased to 75 mg p.o. twice daily.  She was continuing statin medications and labs were requested from PCP.  Had her OSA had not been evaluated in several years.  She was referred to Dr. Radford Pax for evaluation.  She presents today with a recent 1 day history of palpitations/irregular heart rhythm with associated shortness of breath.  She states she has had some brief episodes in the past but nothing that persisted.  EKG today on arrival shows atrial fibrillation with RVR rate of 126.  She has a history of obstructive sleep apnea using CPAP.  She states she is having some issues with the machine and the apparatus she uses.  She would like to be referred for reevaluation of her OSA.  She denies any anginal or exertional symptoms, orthostatic symptoms, CVA or TIA-like symptoms.  He has been having issues with her right eye with retinal detachment and subsequent surgeries.  She states she  has had 3 retinal surgeries with the most recent being 2 weeks ago.  She sees Dr. Ernst Breach in [place] who is a retinal specialist.  She denies any PND/orthopnea history.  Denies any bleeding issues.  Denies any  claudication-like symptoms, DVT or PE-like symptoms.  Blood pressure is well controlled on current therapy.  Current cardiac regimen includes amlodipine 10 mg daily, atorvastatin 10 mg daily, hydralazine 25 mg p.o. twice daily, labetalol 200 mg p.o. twice daily, olmesartan 20 mg daily.  New onset atrial fibrillation. Eliquis was started. Referred back to Dr Radford Pax for Sleep issues OSA. Does she need DCCV ?   Past Medical History:  Diagnosis Date   Anxiety disorder    CAD (coronary artery disease)    Cath 07/2014 with nonobstructive CAD mild to moderate, echo 07/2014 LVEF 60 to 65%.   Carpal tunnel syndrome, right    Cyst of skin    Depression    Depression, episodic 07/27/2014   Hypercholesteremia    Hypertensive heart disease 07/27/2014   Insomnia    Lumbar radiculopathy    Non-smoker    Obesity (BMI 30-39.9)    OSA (obstructive sleep apnea)    using CPAP sometimes   Type 2 diabetes mellitus with peripheral neuropathy ([place]) 07/27/2014    Past Surgical History:  Procedure Laterality Date   COLONOSCOPY WITH PROPOFOL N/A 03/31/2020   Procedure: COLONOSCOPY WITH PROPOFOL;  Surgeon: Daneil Dolin, MD;  [facility];  Service: Endoscopy;  Laterality: N/A;  10:00am, pt can't come earlier, transportation   [place] N/A 07/29/2014   Procedure: LEFT HEART CATHETERIZATION WITH CORONARY ANGIOGRAM;  Surgeon: Sinclair Grooms, MD;  Location: [facility]  CATH LAB;  Service: Cardiovascular;  Laterality: N/A;   POLYPECTOMY  03/31/2020   Procedure: POLYPECTOMY;  Surgeon: Daneil Dolin, MD;  [facility];  Service: Endoscopy;;    Current Outpatient Medications  Medication Sig Dispense Refill   amLODipine (NORVASC) 10 MG tablet Take 1 tablet (10 mg total) by mouth daily. 30 tablet 12   apixaban (ELIQUIS) 5 MG TABS tablet Take 1 tablet (5 mg total) by mouth 2 (two) times daily. 60 tablet 6   atorvastatin (LIPITOR) 10 MG tablet Take 10 mg by  mouth daily.     busPIRone (BUSPAR) 10 MG tablet Take 10 mg by mouth 3 (three) times daily.     DULoxetine (CYMBALTA) 30 MG capsule Take 30 mg by mouth daily.     fexofenadine (ALLEGRA) 180 MG tablet Take 180 mg by mouth daily as needed for allergies or rhinitis.     gabapentin (NEURONTIN) 300 MG capsule Take 300 mg by mouth 2 (two) times daily.     glimepiride (AMARYL) 4 MG tablet Take 4 mg by mouth in the morning and at bedtime.      hydrALAZINE (APRESOLINE) 50 MG tablet Take 1.5 tablets (75 mg total) by mouth 2 (two) times daily. (Patient taking differently: Take 50 mg by mouth 2 (two) times daily.) 270 tablet 1   insulin aspart protamine - aspart (NOVOLOG MIX 70/30 FLEXPEN) (70-30) 100 UNIT/ML FlexPen Inject 30-32 Units into the skin See admin instructions. Inject 32 units into the skin in the morning and 30 units in the evening     JARDIANCE 10 MG TABS tablet Take 10 mg by mouth daily.     labetalol (NORMODYNE) 200 MG tablet Take 200 mg by mouth 2 (two) times daily.     LORazepam (ATIVAN) 1 MG tablet Take 1 mg by mouth at bedtime as needed for anxiety.      metFORMIN (GLUCOPHAGE) 1000 MG tablet Take 1,000 mg by mouth 2 (two) times daily with a meal.     metoprolol tartrate (LOPRESSOR) 25 MG tablet Take 0.5 tablets (12.5 mg total) by mouth 2 (two) times daily. 30 tablet 6   olmesartan (BENICAR) 20 MG tablet Take 20 mg by mouth daily.     oxyCODONE (OXY IR/ROXICODONE) 5 MG immediate release tablet Take 5 mg by mouth every 6 (six) hours as needed for severe pain.     prednisoLONE acetate (PRED FORTE) 1 % ophthalmic suspension Place 1 drop into the right eye in the morning and at bedtime.     No current facility-administered medications for this visit.   Allergies:  Percocet [oxycodone-acetaminophen] and Penicillins   Social History: The patient  reports that she has never smoked. She has never used smokeless tobacco. She reports that she does not drink alcohol and does not use drugs.    Family History: The patient's family history includes CVA in her father; Cancer in her mother; Heart disease in her mother.   ROS:  Please see the history of present illness. Otherwise, complete review of systems is positive for none.  All other systems are reviewed and negative.   Physical Exam: VS:  LMP  (LMP Unknown) , BMI There is no height or weight on file to calculate BMI.  Wt Readings from Last 3 Encounters:  01/05/21 219 lb 12.8 oz (99.7 kg)  03/30/20 211 lb (95.7 kg)  02/20/20 (!) 211 lb (95.7 kg)    General: Obese patient appears comfortable at rest. Neck: Supple, no elevated JVP or carotid  bruits, no thyromegaly. Lungs: Clear to auscultation, nonlabored breathing at rest. Cardiac: Irregularly irregular tachycardic  rhythm, no S3 or significant systolic murmur, no pericardial rub. Extremities: No pitting edema, distal pulses 2+. Skin: Warm and dry. Musculoskeletal: No kyphosis. Neuropsychiatric: Alert and oriented x3, affect grossly appropriate.  ECG:  An ECG dated 01/05/2021 was personally reviewed today and demonstrated:  Atrial fibrillation with RVR rate of 126, septal infarct age undetermined  Recent Labwork: 03/30/2020: BUN 14; Creatinine, Ser 1.04; Potassium 4.0; Sodium 136     Component Value Date/Time   CHOL 258 (H) 07/28/2014 0045   TRIG 185 (H) 07/28/2014 0045   HDL 36 (L) 07/28/2014 0045   CHOLHDL 7.2 07/28/2014 0045   VLDL 37 07/28/2014 0045   LDLCALC 185 (H) 07/28/2014 0045    Other Studies Reviewed Today:   07/2014 Echo Study Conclusions  - Left ventricle: The cavity size was normal. Wall thickness was   increased in a pattern of moderate LVH. Systolic function was   normal. The estimated ejection fraction was in the range of 60%   to 65%. Wall motion was normal; there were no regional wall   motion abnormalities. - Left atrium: The atrium was mildly dilated. - Pericardium, extracardiac: A trivial pericardial effusion was   identified.      07/2014 Cath HEMODYNAMICS:  Aortic pressure 162/93 mmHg; LV pressure 163/23 mmHg; LVEDP 27 mmHg     ANGIOGRAPHIC DATA:   The left main coronary artery is normal.   The left anterior descending artery is large and wraps around the left ventricular apex. There is mid vessel eccentric 30% narrowing and distal vessel eccentric 60-70% narrowing. The large first diagonal contains focal mid vessel 50% narrowing..   The left circumflex artery is patent. 2 marginal branches arise from the circumflex. He can marginal is a dominant of the 2 vessels. The proximal obtuse marginal contains a 50% narrowing..   The right coronary artery is  dominant and widely patent.     LEFT VENTRICULOGRAM:  Left ventricular angiogram was done in the 30 RAO projection and revealed brisk symmetric contractility with EF 60%. Elevated end-diastolic pressures noted.      IMPRESSIONS:  1. Nonobstructive coronary artery disease involving the left anterior descending, the first diagonal, and the first obtuse marginal branch.   2. Normal left ventricular systolic function with EF of at least 60%, left ventricular hypertrophy, and evidence of diastolic heart failure (LVEDP greater than 23 mmHg )   POST CATH RECOMMENDATION:     1. Aggressive management of hypertension   2. Eligible for discharge when blood pressure is controlled.        Assessment and Plan:  1. New onset atrial fibrillation ([place])   2. CAD in native artery   3. Essential hypertension   4. Mixed hyperlipidemia   5. OSA (obstructive sleep apnea)      1. New onset atrial fibrillation [facility]) Patient noticed recent increase in rapid heart rhythm/palpitations yesterday which has persisted.  States she has some shortness of breath associated.  EKG on arrival today shows atrial fibrillation with RVR rate of 126.  CHA2DS2-VASc score 3.  (HTN, DM, gender).  Start metoprolol 12.5 mg p.o. twice daily.  Start Eliquis 5 mg p.o. twice daily.  I spoke to the assistant  of Dr. Baird Cancer ophthalmologist it [place] who was deferring starting anticoagulation to [place].  I have been asked to fax the note to Dr. Iona Hansen for his records.   2. CAD in native  artery Denies any anginal or exertional symptoms.  Currently not on aspirin.  We have started Eliquis for new onset atrial fibrillation.  3. Essential hypertension Blood pressure currently well controlled on current therapy.  Blood pressure today 116/62.  Continue amlodipine 10 mg daily, hydralazine 75 mg p.o. twice daily, labetalol 200 mg p.o. twice daily.  Olmesartan 20 mg daily.  4. Mixed hyperlipidemia Continue atorvastatin 10 mg daily.  5. OSA (obstructive sleep apnea) Patient states she would like to be reevaluated secondary to her sleep apnea.  She states she does not believe her CPAP machine is working well and she is not tolerating current mask apparatus.  Please refer to Dr. Radford Pax for reevaluation for OSA.   Medication Adjustments/Labs and Tests Ordered: Current medicines are reviewed at length with the patient today.  Concerns regarding medicines are outlined above.   Disposition: Follow-up with Dr. Harl Bowie or APP 1 month  Signed, Levell July, NP 01/10/2021 7:34 PM    [facility] at [place], [place], [place] [address] Phone: [phone]; Fax: [phone]

## 2021-01-11 ENCOUNTER — Ambulatory Visit: Payer: 59 | Admitting: Family Medicine

## 2021-01-19 ENCOUNTER — Telehealth: Payer: Self-pay | Admitting: Cardiology

## 2021-01-19 DIAGNOSIS — I4891 Unspecified atrial fibrillation: Secondary | ICD-10-CM

## 2021-01-19 NOTE — Telephone Encounter (Signed)
Advised that she missed her f/u visit on 01/11/21 and needed to be re-evaluated. Appointment scheduled in [facility] tomorrow at 10:30 am. Patient aware.

## 2021-01-19 NOTE — Telephone Encounter (Signed)
Pt c/o medication issue:  1. Name of Medication: Lopressor  2. How are you currently taking this medication (dosage and times per day)? 1/2 a day.  3. Are you having a reaction (difficulty breathing--STAT)? Patient states it is not working.  4. What is your medication issue?  Patient states she is having discomfort in her chest, when active-she quickly gets out of breath.  Saw PCP today and was put on Buspar to help with anxiety. Her HR was 134 but came down to 84 while in the office. Would like to discuss medication.

## 2021-01-20 ENCOUNTER — Encounter (HOSPITAL_COMMUNITY): Payer: Self-pay | Admitting: Nurse Practitioner

## 2021-01-20 ENCOUNTER — Other Ambulatory Visit: Payer: Self-pay

## 2021-01-20 ENCOUNTER — Ambulatory Visit (HOSPITAL_COMMUNITY)
Admission: RE | Admit: 2021-01-20 | Discharge: 2021-01-20 | Disposition: A | Discharge status: Home / Self Care | Payer: 59 | Source: Ambulatory Visit | Attending: Nurse Practitioner | Admitting: Nurse Practitioner

## 2021-01-20 VITALS — BP 164/84 | HR 105 | Ht 64.0 in | Wt 220.8 lb

## 2021-01-20 DIAGNOSIS — Z6839 Body mass index (BMI) 39.0-39.9, adult: Secondary | ICD-10-CM | POA: Insufficient documentation

## 2021-01-20 DIAGNOSIS — R635 Abnormal weight gain: Secondary | ICD-10-CM

## 2021-01-20 DIAGNOSIS — I10 Essential (primary) hypertension: Secondary | ICD-10-CM

## 2021-01-20 DIAGNOSIS — Z7984 Long term (current) use of oral hypoglycemic drugs: Secondary | ICD-10-CM | POA: Insufficient documentation

## 2021-01-20 DIAGNOSIS — D6869 Other thrombophilia: Secondary | ICD-10-CM

## 2021-01-20 DIAGNOSIS — E7800 Pure hypercholesterolemia, unspecified: Secondary | ICD-10-CM | POA: Insufficient documentation

## 2021-01-20 DIAGNOSIS — I2510 Atherosclerotic heart disease of native coronary artery without angina pectoris: Secondary | ICD-10-CM | POA: Diagnosis not present

## 2021-01-20 DIAGNOSIS — E669 Obesity, unspecified: Secondary | ICD-10-CM | POA: Diagnosis not present

## 2021-01-20 DIAGNOSIS — Z79899 Other long term (current) drug therapy: Secondary | ICD-10-CM | POA: Insufficient documentation

## 2021-01-20 DIAGNOSIS — I4891 Unspecified atrial fibrillation: Secondary | ICD-10-CM | POA: Diagnosis present

## 2021-01-20 DIAGNOSIS — E785 Hyperlipidemia, unspecified: Secondary | ICD-10-CM | POA: Diagnosis not present

## 2021-01-20 DIAGNOSIS — Z7901 Long term (current) use of anticoagulants: Secondary | ICD-10-CM | POA: Diagnosis not present

## 2021-01-20 DIAGNOSIS — Z794 Long term (current) use of insulin: Secondary | ICD-10-CM | POA: Insufficient documentation

## 2021-01-20 DIAGNOSIS — E119 Type 2 diabetes mellitus without complications: Secondary | ICD-10-CM | POA: Diagnosis not present

## 2021-01-20 MED ORDER — POTASSIUM CHLORIDE CRYS ER 10 MEQ PO TBCR: EXTENDED_RELEASE_TABLET | ORAL | 0 refills | Status: AC

## 2021-01-20 MED ORDER — METOPROLOL TARTRATE 25 MG PO TABS: 25.0000 mg | ORAL_TABLET | Freq: Two times a day (BID) | ORAL | 2 refills | Status: DC

## 2021-01-20 MED ORDER — FUROSEMIDE 20 MG PO TABS: ORAL_TABLET | ORAL | 0 refills | Status: AC

## 2021-01-20 NOTE — Patient Instructions (Signed)
Increase metoprolol to 25mg twice a day

## 2021-01-20 NOTE — Addendum Note (Signed)
Encounter addended by: Enid Derry, CMA on: 01/20/2021 4:33 PM  Actions taken: Order list changed, Pharmacy for encounter modified

## 2021-01-20 NOTE — Progress Notes (Signed)
Primary Care Physician: Glenda Chroman, MD Referring Physician: Katina Dung, NP     Andrea Grant is a 60 y.o. female with a h/o CAD, HTN, sleep apnea, HLD OSA, DM2, chest pain.She was seen by Katina Dung, NP 01/04/21 with pt presenting with one day of palpitations and finding new onset Afib with RVR. She was started on BB and DOAC. She was given a f/u visit but she missed that appointment so was asked to be seen in the afib clinic  for f/u here instead.   She has noted fatigue and some exertional dyspnea. Despite being on metoprolol 12.5 mg bid she still has RVR at 105 bpm today.she also had a detached retina with recent surgery. She has a gas bubble form that at 60% with her recent last eye appointment and has an appointment to be checked again 7/7. I spoke  to the opthalmologic office today and it is the recommendation to delay cardioversion until she has her recheck 7/7.   She does not smoke or drink alcohol. No excessive caffeine. She does have untreated sleep apnea. She could not tolerate cpap in the past. .   Her weight appears to be up by 10 lbs. She has been advised not to sleep on her back for the eye gas bubble.   Today, she denies symptoms of palpitations, chest pain, shortness of breath, orthopnea, PND, lower extremity edema, dizziness, presyncope, syncope, or neurologic sequela. The patient is tolerating medications without difficulties and is otherwise without complaint today.   Past Medical History:  Diagnosis Date   Anxiety disorder    CAD (coronary artery disease)    Cath 07/2014 with nonobstructive CAD mild to moderate, echo 07/2014 LVEF 60 to 65%.   Carpal tunnel syndrome, right    Cyst of skin    Depression    Depression, episodic 07/27/2014   Hypercholesteremia    Hypertensive heart disease 07/27/2014   Insomnia    Lumbar radiculopathy    Non-smoker    Obesity (BMI 30-39.9)    OSA (obstructive sleep apnea)    using CPAP sometimes   Type 2 diabetes mellitus with  peripheral neuropathy ([place]) 07/27/2014   Past Surgical History:  Procedure Laterality Date   COLONOSCOPY WITH PROPOFOL N/A 03/31/2020   Procedure: COLONOSCOPY WITH PROPOFOL;  Surgeon: Daneil Dolin, MD;  [facility];  Service: Endoscopy;  Laterality: N/A;  10:00am, pt can't come earlier, transportation   [place] N/A 07/29/2014   Procedure: LEFT HEART CATHETERIZATION WITH CORONARY ANGIOGRAM;  Surgeon: Sinclair Grooms, MD;  Location: [facility] CATH LAB;  Service: Cardiovascular;  Laterality: N/A;   POLYPECTOMY  03/31/2020   Procedure: POLYPECTOMY;  Surgeon: Daneil Dolin, MD;  [facility];  Service: Endoscopy;;    Current Outpatient Medications  Medication Sig Dispense Refill   amLODipine (NORVASC) 10 MG tablet Take 1 tablet (10 mg total) by mouth daily. 30 tablet 12   apixaban (ELIQUIS) 5 MG TABS tablet Take 1 tablet (5 mg total) by mouth 2 (two) times daily. 60 tablet 6   atorvastatin (LIPITOR) 10 MG tablet Take 10 mg by mouth daily.     busPIRone (BUSPAR) 10 MG tablet Take 10 mg by mouth 3 (three) times daily.     DULoxetine (CYMBALTA) 30 MG capsule Take 30 mg by mouth daily.     fexofenadine (ALLEGRA) 180 MG tablet Take 180 mg by mouth daily as needed for allergies or rhinitis.  gabapentin (NEURONTIN) 300 MG capsule Take 300 mg by mouth 2 (two) times daily.     glimepiride (AMARYL) 4 MG tablet Take 4 mg by mouth in the morning and at bedtime.      hydrALAZINE (APRESOLINE) 50 MG tablet Take 1.5 tablets (75 mg total) by mouth 2 (two) times daily. (Patient taking differently: Take 50 mg by mouth 2 (two) times daily.) 270 tablet 1   insulin aspart protamine - aspart (NOVOLOG MIX 70/30 FLEXPEN) (70-30) 100 UNIT/ML FlexPen Inject 30-32 Units into the skin See admin instructions. Inject 32 units into the skin in the morning and 30 units in the evening     JARDIANCE 10 MG TABS tablet Take 10 mg by mouth daily.     labetalol  (NORMODYNE) 200 MG tablet Take 200 mg by mouth 2 (two) times daily.     LORazepam (ATIVAN) 1 MG tablet Take 1 mg by mouth at bedtime as needed for anxiety.      metFORMIN (GLUCOPHAGE) 1000 MG tablet Take 1,000 mg by mouth 2 (two) times daily with a meal.     metoprolol tartrate (LOPRESSOR) 25 MG tablet Take 0.5 tablets (12.5 mg total) by mouth 2 (two) times daily. 30 tablet 6   olmesartan (BENICAR) 20 MG tablet Take 20 mg by mouth daily.     oxyCODONE (OXY IR/ROXICODONE) 5 MG immediate release tablet Take 5 mg by mouth every 6 (six) hours as needed for severe pain.     prednisoLONE acetate (PRED FORTE) 1 % ophthalmic suspension Place 1 drop into the right eye in the morning and at bedtime.     No current facility-administered medications for this encounter.    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  Tobacco Use   Smoking status: Never   Smokeless tobacco: Never  Substance and Sexual Activity   Alcohol use: No    Alcohol/week: 0.0 standard drinks   Drug use: No   Sexual activity: Never    Birth control/protection: Abstinence  Other Topics Concern   Not on file  Social History Narrative   Lives with 2 sons   Social Determinants of Health   Financial Resource Strain: Not on file  Food Insecurity: Not on file  Transportation Needs: Not on file  Physical Activity: Not on file  Stress: Not on file  Social Connections: Not on file  Intimate Partner Violence: Not on file    Family History  Problem Relation Age of Onset   Cancer Mother    Heart disease Mother    CVA Father    Colon cancer Neg Hx     ROS- All systems are reviewed and  negative except as per the HPI above  Physical Exam: There were no vitals filed for this visit. Wt Readings from Last 3 Encounters:  01/05/21 99.7 kg  03/30/20 95.7 kg  02/20/20 (!) 95.7 kg    Labs: Lab Results  Component Value Date   NA 136 03/30/2020   K 4.0 03/30/2020   CL 105 03/30/2020   CO2 21 (L) 03/30/2020   GLUCOSE 61 (L) 03/30/2020  BUN 14 03/30/2020   CREATININE 1.04 (H) 03/30/2020   CALCIUM 9.0 03/30/2020   MG 1.8 07/27/2014   Lab Results  Component Value Date   INR 1.03 07/29/2014   Lab Results  Component Value Date   CHOL 258 (H) 07/28/2014   HDL 36 (L) 07/28/2014   LDLCALC 185 (H) 07/28/2014   TRIG 185 (H) 07/28/2014     GEN- The patient is well appearing, alert and oriented x 3 today.   Head- normocephalic, atraumatic Eyes-  Sclera clear, conjunctiva pink Ears- hearing intact Oropharynx- clear Neck- supple, no JVP Lymph- no cervical lymphadenopathy Lungs- Clear to ausculation bilaterally, normal work of breathing Heart- irregular rate and rhythm, no murmurs, rubs or gallops, PMI not laterally displaced GI- soft, NT, ND, + BS Extremities- no clubbing, cyanosis, or edema MS- no significant deformity or atrophy Skin- no rash or lesion Psych- euthymic mood, full affect Neuro- strength and sensation are intact  EKG-afib at 105 bpm, qrs int 82 ms, qtc 454 ms     Assessment and Plan:  1. New onset afib Appears persistent General education re afib  Triggers discussed Has untreated sleep apnea She is interested in a mouth appliance for treatment of OAS.  Dr. Dola Argyle in [place] does this but she would need  sleep study updated  She would prefer  sleep study   to be in [place]  She will discuss with Jonni Sanger to get scheduled on f/u with him I will increase metoprolol to 25 mg bid for better rate control  She appears to have picked up 10 lbs of fluid  I will start on 20 mg lasix with 10 meq K+ for 5 days to see if weight will return to baseline and  then as needed to control fluid weight  It is advised for her to not have cardioversion until gas bubble from detached retina/surgery is re revaluated by her retinal specialist 7/7 per my phone call with them today  She prefers to have CV set up at [facility] and hopefully will have been cleared by her retinal doctor by her f/u visit with Jonni Sanger 7/12  2. CHA2DS2VASc  score of 3 She states no missed doses of eliquis 5 mg bid since starting 6/8  and reminded not to miss any going forward  Will need a nurse visit for weight, bmet and HR/BP in one week Then f/u with Katina Dung, NP  7/12  Geroge Baseman. Keileigh Vahey, [facility] [address] [place], [place] [address] [phone]

## 2021-01-25 ENCOUNTER — Other Ambulatory Visit: Payer: Self-pay | Admitting: *Deleted

## 2021-01-25 DIAGNOSIS — R635 Abnormal weight gain: Secondary | ICD-10-CM

## 2021-01-25 DIAGNOSIS — I10 Essential (primary) hypertension: Secondary | ICD-10-CM

## 2021-01-25 NOTE — Progress Notes (Addendum)
Patient notified and verbalized understanding.  OV scheduled for Thursday, 01/28/2021 at 2:00 with Katina Dung, NP in [place] office.  She will do her lab at [facility] on Wednesday, 01/27/2021.

## 2021-01-25 NOTE — Addendum Note (Signed)
Encounter addended by: Laurine Blazer, LPN on: 3/58/2518 98:42 AM  Actions taken: Visit diagnoses modified, Clinical Note Signed

## 2021-01-27 ENCOUNTER — Other Ambulatory Visit: Payer: Self-pay

## 2021-01-27 ENCOUNTER — Other Ambulatory Visit (HOSPITAL_COMMUNITY)
Admission: RE | Admit: 2021-01-27 | Discharge: 2021-01-27 | Disposition: A | Discharge status: Home / Self Care | Payer: 59 | Source: Ambulatory Visit | Attending: Family Medicine | Admitting: Family Medicine

## 2021-01-27 DIAGNOSIS — R635 Abnormal weight gain: Secondary | ICD-10-CM | POA: Diagnosis present

## 2021-01-27 DIAGNOSIS — I10 Essential (primary) hypertension: Secondary | ICD-10-CM | POA: Diagnosis present

## 2021-01-27 LAB — BASIC METABOLIC PANEL
Anion gap: 7 (ref 5–15)
BUN: 21 mg/dL — ABNORMAL HIGH (ref 6–20)
CO2: 29 mmol/L (ref 22–32)
Calcium: 9.1 mg/dL (ref 8.9–10.3)
Chloride: 102 mmol/L (ref 98–111)
Creatinine, Ser: 1.22 mg/dL — ABNORMAL HIGH (ref 0.44–1.00)
GFR, Estimated: 51 mL/min — ABNORMAL LOW (ref >60)
Glucose, Bld: 108 mg/dL — ABNORMAL HIGH (ref 70–99)
Potassium: 4.4 mmol/L (ref 3.5–5.1)
Sodium: 138 mmol/L (ref 135–145)

## 2021-01-27 NOTE — Progress Notes (Signed)
Cardiology Office Note  Date: 01/28/2021   ID: Andrea Grant, DOB [birth year], MRN [id number]  PCP:  Andrea Chroman, MD  Cardiologist:  Andrea Dolly, MD Electrophysiologist:  None   Chief Complaint: Follow-up per EP request  History of Present Illness: Andrea Grant is a 60 y.o. female with a history of CAD, HTN, sleep apnea, HLD OSA, DM2, chest pain . She was last seen by Andrea Grant 12/03/2019.  She had no recent significant chest pains.  She is compliant with her antihypertensive medications.  She was seeing her PCP for her diabetes.  She was compliant with her statin medications and labs and follow-up PCP.  She compliant with her CPAP.  She had mild to moderate CAD on recent cardiac catheterization without symptoms.  She was continuing current medications.  Blood pressure was above goal and hydralazine was increased to 75 mg p.o. twice daily.  She was continuing statin medications and labs were requested from PCP.  Had her OSA had not been evaluated in several years.  She was referred to Andrea Grant for evaluation.  She presented last visit with a recent 1 day history of palpitations/irregular heart rhythm with associated shortness of breath.  She stated she had some brief episodes in the past but nothing that persisted.  EKG today on arrival shows atrial fibrillation with RVR rate of 126.  History of obstructive sleep apnea using CPAP.  Stated she was having some issues with the machine and the apparatus she uses.  She would like to be referred for reevaluation of her OSA.  Denied any anginal or exertional symptoms, orthostatic symptoms, CVA or TIA-like symptoms.  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  Stated she had had 3 retinal surgeries with the most recent being 2 weeks ago.  She was seeing Andrea Grant in [place] who is a retinal specialist.  She denied any PND orthopnea history or bleeding issues.  Denied any claudication-like  symptoms, DVT or PE-like symptoms.  Blood pressure is well controlled on current therapy.  Current cardiac regimen included amlodipine 10 mg daily, atorvastatin 10 mg daily, hydralazine 25 mg p.o. twice daily, labetalol 200 mg p.o. twice daily, olmesartan 20 mg daily.  She missed her follow up appointment on 01/11/2021. She called the office stating her AF rate was still not controlled. She was referred to atrial fibrillation clinic.   She saw Andrea Palau NP on 01/20/2021 at atrial fibrillation clinic. Her Metoprolol was increased to 25 mg po bid due to atrial fib rate of 105. She was having some exertional dyspnea and some weight gain of 10 lbs.  She was started on Lasix 20 mg po x 3 days then prn for weight gain 3-5 pounds along with K+ supplementation. Andrea Grant spoke to ophthalmology office and it was recommended she delay cardioversion until seen again on 7/7. She has a gas bubble secondary to detached retina and subsequent surgery. CHA2DS2VASc  score of 3.  Recent basic metabolic panel results yesterday showed sodium of 138, potassium 4.4, chloride 102, CO2 29, glucose 108, BUN 21, creatinine 1.22, GFR 51.  Her weight on 01/05/2021 was 219.  Her weight today is 219.  She is here for follow-up today to check her weight and follow-up on lab work.  She states she is back on Lasix as needed after taking it 3 days straight.  She states she feels much better since Andrea Grant increased her metoprolol.  Her weight  is staying stable at 219.  She denies any DOE or weight gain.  She states she can feel her heart rate getting a little fast at times.  I advised her she could take an extra half pill of her metoprolol as needed for increased heart rate.  She states she has not been very active lately due to fear of heart rate going faster.  She denies any significant heart rate increases.  Heart rate on arrival was 88.  Denies any CVA or TIA-like symptoms, bleeding.  She has an upcoming follow-up with ophthalmology  on July 7 and follow-up here on July 12  Past Medical History:  Diagnosis Date   Anxiety disorder    CAD (coronary artery disease)    Cath 07/2014 with nonobstructive CAD mild to moderate, echo 07/2014 LVEF 60 to 65%.   Carpal tunnel syndrome, right    Cyst of skin    Depression    Depression, episodic 07/27/2014   Hypercholesteremia    Hypertensive heart disease 07/27/2014   Insomnia    Lumbar radiculopathy    Non-smoker    Obesity (BMI 30-39.9)    OSA (obstructive sleep apnea)    using CPAP sometimes   Type 2 diabetes mellitus with peripheral neuropathy ([place]) 07/27/2014    Past Surgical History:  Procedure Laterality Date   COLONOSCOPY WITH PROPOFOL N/A 03/31/2020   Procedure: COLONOSCOPY WITH PROPOFOL;  Surgeon: Andrea Dolin, MD;  [facility];  Service: Endoscopy;  Laterality: N/A;  10:00am, pt can't come earlier, transportation   [place] N/A 07/29/2014   Procedure: LEFT HEART CATHETERIZATION WITH CORONARY ANGIOGRAM;  Surgeon: Andrea Grooms, MD;  Location: [facility] CATH LAB;  Service: Cardiovascular;  Laterality: N/A;   POLYPECTOMY  03/31/2020   Procedure: POLYPECTOMY;  Surgeon: Andrea Dolin, MD;  [facility];  Service: Endoscopy;;    Current Outpatient Medications  Medication Sig Dispense Refill   amLODipine (NORVASC) 10 MG tablet Take 1 tablet (10 mg total) by mouth daily. 30 tablet 12   apixaban (ELIQUIS) 5 MG TABS tablet Take 1 tablet (5 mg total) by mouth 2 (two) times daily. 60 tablet 6   atorvastatin (LIPITOR) 10 MG tablet Take 10 mg by mouth daily.     busPIRone (BUSPAR) 5 MG tablet Take 5 mg by mouth 2 (two) times daily.     Carboxymethylcellulose Sodium (ARTIFICIAL TEARS OP) Place 1 drop into both eyes daily in the afternoon.     DULoxetine (CYMBALTA) 30 MG capsule Take 30 mg by mouth daily.     fexofenadine (ALLEGRA) 180 MG tablet Take 180 mg by mouth daily as needed for allergies or rhinitis.      furosemide (LASIX) 20 MG tablet Take one tablet by mouth for 3 days and if gains 3-5 pounds take one tablet as needed 10 tablet 0   gabapentin (NEURONTIN) 300 MG capsule Take 300 mg by mouth 3 (three) times daily.     glimepiride (AMARYL) 4 MG tablet Take 4 mg by mouth in the morning and at bedtime.      hydrALAZINE (APRESOLINE) 50 MG tablet Take 50 mg by mouth 2 (two) times daily.     insulin aspart protamine - aspart (NOVOLOG MIX 70/30 FLEXPEN) (70-30) 100 UNIT/ML FlexPen Inject 30-32 Units into the skin See admin instructions. Inject 32 units into the skin in the morning and 30 units in the evening     JARDIANCE 10 MG TABS tablet Take 10  mg by mouth daily.     labetalol (NORMODYNE) 200 MG tablet Take 200 mg by mouth 2 (two) times daily.     LORazepam (ATIVAN) 0.5 MG tablet Take 0.5 mg by mouth daily as needed.     metFORMIN (GLUCOPHAGE) 1000 MG tablet Take 1,000 mg by mouth 2 (two) times daily with a meal.     metoprolol tartrate (LOPRESSOR) 25 MG tablet Take 1 tablet (25 mg total) by mouth 2 (two) times daily. 60 tablet 2   potassium chloride (KLOR-CON) 10 MEQ tablet Take one tablet by mouth for 3 days with the lasix as needed 10 tablet 0   No current facility-administered medications for this visit.   Allergies:  Percocet [oxycodone-acetaminophen] and Penicillins   Social History: The Andrea Grant  reports that she has never smoked. She has never used smokeless tobacco. She reports that she does not drink alcohol and does not use drugs.   Family History: The Andrea Grant's family history includes CVA in her father; Cancer in her mother; Heart disease in her mother.   ROS:  Please see the history of present illness. Otherwise, complete review of systems is positive for none.  All other systems are reviewed and negative.   Physical Exam: VS:  BP 126/80   Pulse 88   Ht 5\' 4"  (1.626 m)   Wt 219 lb 9.6 oz (99.6 kg)   LMP  (LMP Unknown)   SpO2 98%   BMI 37.69 kg/m , BMI Body mass index is 37.69  kg/m.  Wt Readings from Last 3 Encounters:  01/28/21 219 lb 9.6 oz (99.6 kg)  01/20/21 220 lb 12.8 oz (100.2 kg)  01/05/21 219 lb 12.8 oz (99.7 kg)    General: Obese Andrea Grant appears comfortable at rest. Neck: Supple, no elevated JVP or carotid bruits, no thyromegaly. Lungs: Clear to auscultation, nonlabored breathing at rest. Cardiac: Irregularly irregular tachycardic  rhythm, no S3 or significant systolic murmur, no pericardial rub. Extremities: No pitting edema, distal pulses 2+. Skin: Warm and dry. Musculoskeletal: No kyphosis. Neuropsychiatric: Alert and oriented x3, affect grossly appropriate.  ECG: 01/28/2021 EKG atrial fibrillation with RVR rate of 101.  Initial heart rate on arrival was 88.  Recent Labwork: 01/27/2021: BUN 21; Creatinine, Ser 1.22; Potassium 4.4; Sodium 138     Component Value Date/Time   CHOL 258 (H) 07/28/2014 0045   TRIG 185 (H) 07/28/2014 0045   HDL 36 (L) 07/28/2014 0045   CHOLHDL 7.2 07/28/2014 0045   VLDL 37 07/28/2014 0045   LDLCALC 185 (H) 07/28/2014 0045    Other Studies Reviewed Today:   07/2014 Echo Study Conclusions  - Left ventricle: The cavity size was normal. Wall thickness was   increased in a pattern of moderate LVH. Systolic function was   normal. The estimated ejection fraction was in the range of 60%   to 65%. Wall motion was normal; there were no regional wall   motion abnormalities. - Left atrium: The atrium was mildly dilated. - Pericardium, extracardiac: A trivial pericardial effusion was   identified.     07/2014 cardiac catheterization   HEMODYNAMICS:  Aortic pressure 162/93 mmHg; LV pressure 163/23 mmHg; LVEDP 27 mmHg     ANGIOGRAPHIC DATA:   The left main coronary artery is normal.   The left anterior descending artery is large and wraps around the left ventricular apex. There is mid vessel eccentric 30% narrowing and distal vessel eccentric 60-70% narrowing. The large first diagonal contains focal mid vessel  50% narrowing.Marland Kitchen  The left circumflex artery is patent. 2 marginal branches arise from the circumflex. He can marginal is a dominant of the 2 vessels. The proximal obtuse marginal contains a 50% narrowing..   The right coronary artery is  dominant and widely patent.     LEFT VENTRICULOGRAM:  Left ventricular angiogram was done in the 30 RAO projection and revealed brisk symmetric contractility with EF 60%. Elevated end-diastolic pressures noted.      IMPRESSIONS:  1. Nonobstructive coronary artery disease involving the left anterior descending, the first diagonal, and the first obtuse marginal branch.   2. Normal left ventricular systolic function with EF of at least 60%, left ventricular hypertrophy, and evidence of diastolic heart failure (LVEDP greater than 23 mmHg )   POST CATH RECOMMENDATION:     1. Aggressive management of hypertension   2. Eligible for discharge when blood pressure is controlled.        Assessment and Plan:  1. New onset atrial fibrillation ([place])   2. Essential hypertension   3. Mixed hyperlipidemia   4. OSA (obstructive sleep apnea)   5. Weight gain      1. New onset atrial fibrillation (HCC) Initial heart rate on arrival was 88 today.  EKG today atrial fibrillation with RVR rate of 101.  Andrea Grant states she is feeling much better since being seen in atrial fibrillation clinic.  Her metoprolol was increased to 25 mg p.o. twice daily.  I advised her she could take a half a pill as needed for increased heart rate in addition to her 25 mg p.o. twice daily.  She verbalizes understanding.  CHA2DS2-VASc score 3.  (HTN, DM, gender).  Continue metoprolol 25 mg p.o. twice daily and take 12.5 mg as needed for increased heart rate in addition.  Continue Eliquis 5 mg p.o. twice daily.  She has follow-up with retinal specialist on February 04, 2021 after which will be determined whether she can undergo DC cardioversion.  2. Essential hypertension Blood pressure currently  well controlled on current therapy.  Blood pressure today 126/80 continue amlodipine 10 mg daily, hydralazine 75 mg p.o. twice daily, labetalol 200 mg p.o. twice daily.  Olmesartan 20 mg daily.  3. Mixed hyperlipidemia Continue atorvastatin 10 mg daily.  4. OSA (obstructive sleep apnea) Andrea Grant states she would like to be reevaluated secondary to her sleep apnea.  Andrea Grant would like to be evaluated in [place] for her sleep apnea.  Please refer her to Dr. Elsworth Soho pulmonology for reevaluation for sleep apnea and possible sleep study.  She states she does not believe her CPAP machine is working well and she is not tolerating current mask apparatus.    6. Weight gain She was started on Lasix 20 mg po daily x 3 days then 20 mg po prn for weight gain 3-5 lbs with K+ supplementation by Andrea Palau  NP at recent visit with atrial fibrillation clinic.  Andrea Grant is maintaining her weight at 219.  She is currently taking her Lasix 20 mg daily as needed for weight gain of 3 to 5 pounds.  Recent basic metabolic panel showed mild increase in creatinine at 1.22 with GFR 51.  Continue as needed Lasix for weight gain per above parameters.   Medication Adjustments/Labs and Tests Ordered: Current medicines are reviewed at length with the Andrea Grant today.  Concerns regarding medicines are outlined above.   Disposition: Follow-up with Andrea Grant or [facility] February 09, 2021  Signed, Levell July, NP 01/28/2021 2:51 PM    [facility] [place], [place], [place] [address] Phone: [phone]; Fax: [phone]

## 2021-01-28 ENCOUNTER — Ambulatory Visit (INDEPENDENT_AMBULATORY_CARE_PROVIDER_SITE_OTHER): Payer: 59 | Admitting: Family Medicine

## 2021-01-28 ENCOUNTER — Encounter: Payer: Self-pay | Admitting: Family Medicine

## 2021-01-28 VITALS — BP 126/80 | HR 88 | Ht 64.0 in | Wt 219.6 lb

## 2021-01-28 DIAGNOSIS — I10 Essential (primary) hypertension: Secondary | ICD-10-CM | POA: Diagnosis not present

## 2021-01-28 DIAGNOSIS — I4891 Unspecified atrial fibrillation: Secondary | ICD-10-CM

## 2021-01-28 DIAGNOSIS — G4733 Obstructive sleep apnea (adult) (pediatric): Secondary | ICD-10-CM | POA: Diagnosis not present

## 2021-01-28 DIAGNOSIS — E782 Mixed hyperlipidemia: Secondary | ICD-10-CM

## 2021-01-28 DIAGNOSIS — R635 Abnormal weight gain: Secondary | ICD-10-CM

## 2021-01-28 NOTE — Patient Instructions (Addendum)
Medication Instructions:  Your physician recommends that you continue on your current medications as directed. Please refer to the Current Medication list given to you today.  Labwork: none  Testing/Procedures: none  Follow-Up: Your physician recommends that you schedule a follow-up appointment in: as planned You have been referred to [facility] Pulmonology  Any Other Special Instructions Will Be Listed Below (If Applicable).  If you need a refill on your cardiac medications before your next appointment, please call your pharmacy.

## 2021-02-08 NOTE — Progress Notes (Signed)
Cardiology Office Note  Date: 02/09/2021   ID: Andrea Grant, DOB [birth year], MRN [id number]  PCP:  Glenda Chroman, MD  Cardiologist:  Carlyle Dolly, MD Electrophysiologist:  None   Chief Complaint: Follow-up per EP request  History of Present Illness: Andrea Grant is a 60 y.o. female with a history of CAD, HTN, sleep apnea, HLD OSA, DM2, chest pain . She was last seen by Dr. Harl Bowie 12/03/2019.  She had no recent significant chest pains.  She is compliant with her antihypertensive medications.  She was seeing her PCP for her diabetes.  She was compliant with her statin medications and labs and follow-up PCP.  She compliant with her CPAP.  She had mild to moderate CAD on recent cardiac catheterization without symptoms.  She was continuing current medications.  Blood pressure was above goal and hydralazine was increased to 75 mg p.o. twice daily.  She was continuing statin medications and labs were requested from PCP.  Had her OSA had not been evaluated in several years.  She was referred to Dr. Radford Pax for evaluation.  At a previous visit with a recent 1 day history of palpitations/irregular heart rhythm with associated shortness of breath.  She stated she had some brief episodes in the past but nothing that persisted.  EKG today on arrival shows atrial fibrillation with RVR rate of 126.  History of obstructive sleep apnea using CPAP.  Stated she was having some issues with the machine and the apparatus she uses.  She wanted to be referred for reevaluation of her OSA.  Denied any anginal or exertional symptoms, orthostatic symptoms, CVA or TIA-like symptoms.  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  Stated she had had 3 retinal surgeries with the most recent being 2 weeks ago.  She was seeing Dr. Ernst Breach in [place] who is a retinal specialist.  She denied any PND orthopnea history or bleeding issues.  Denied any claudication-like symptoms,  DVT or PE-like symptoms.  Blood pressure is well controlled on current therapy.  Current cardiac regimen included amlodipine 10 mg daily, atorvastatin 10 mg daily, hydralazine 25 mg p.o. twice daily, labetalol 200 mg p.o. twice daily, olmesartan 20 mg daily.  She missed her follow up appointment on 01/11/2021. She called the office stating her AF rate was still not controlled. She was referred to atrial fibrillation clinic.   She saw Roderic Palau NP on 01/20/2021 at atrial fibrillation clinic. Her Metoprolol was increased to 25 mg po bid due to atrial fib rate of 105. She was having some exertional dyspnea and some weight gain of 10 lbs.  She was started on Lasix 20 mg po x 3 days then prn for weight gain 3-5 pounds along with K+ supplementation. [name] spoke to ophthalmology office and it was recommended she delay cardioversion until seen again on 7/7. She has a gas bubble secondary to detached retina and subsequent surgery. CHA2DS2VASc  score of 3.  Recent basic metabolic panel results yesterday showed sodium of 138, potassium 4.4, chloride 102, CO2 29, glucose 108, BUN 21, creatinine 1.22, GFR 51.  Her weight on 01/05/2021 was 219.  Her weight today is 219.  She was last here for follow-up of her weight her weight and follow-up on lab work.  She was on Lasix as needed.  She stated she felt much better since [name] increased her metoprolol.  Her weight was stable at 219.  She denies any DOE  or weight gain.  She stated she could feel her heart rate getting a little fast at times.  I advised her she could take an extra half pill of her metoprolol as needed for increased heart rate.  She had not been very active lately due to fear of heart rate going faster.  She denied any significant heart rate increases.  She is here today for follow-up.  She denies any significant issues.  EKG today shows atrial fibrillation with a controlled rate of 75.  Blood pressure is a little on the low side at 92/58.   She states she thinks she took her blood pressure medications a little close together.  She states last night she took it much later than usual and took her daytime dose earlier than usual for today.  She is asymptomatic.  She denies any significant palpitations or arrhythmias.  No orthostatic symptoms, CVA or TIA-like symptoms.  She is still having issues with her eye and cardioversion is still on hold until ophthalmology decides she is safe to undergo DCCV.  States she has been having some significant issues with sleep apnea.  She cannot tolerate CPAP.  She states she has seen Dr. Brett Fairy and is considering the new inspire device for sleep apnea.  She denies any bleeding on Eliquis.  Denies any claudication-like symptoms, DVT or PE-like symptoms, or lower extremity edema.   Past Medical History:  Diagnosis Date   Anxiety disorder    CAD (coronary artery disease)    Cath 07/2014 with nonobstructive CAD mild to moderate, echo 07/2014 LVEF 60 to 65%.   Carpal tunnel syndrome, right    Cyst of skin    Depression    Depression, episodic 07/27/2014   Hypercholesteremia    Hypertensive heart disease 07/27/2014   Insomnia    Lumbar radiculopathy    Non-smoker    Obesity (BMI 30-39.9)    OSA (obstructive sleep apnea)    using CPAP sometimes   Type 2 diabetes mellitus with peripheral neuropathy ([place]) 07/27/2014    Past Surgical History:  Procedure Laterality Date   COLONOSCOPY WITH PROPOFOL N/A 03/31/2020   Procedure: COLONOSCOPY WITH PROPOFOL;  Surgeon: Daneil Dolin, MD;  [facility];  Service: Endoscopy;  Laterality: N/A;  10:00am, pt can't come earlier, transportation   [place] N/A 07/29/2014   Procedure: LEFT HEART CATHETERIZATION WITH CORONARY ANGIOGRAM;  Surgeon: Sinclair Grooms, MD;  Location: [facility] CATH LAB;  Service: Cardiovascular;  Laterality: N/A;   POLYPECTOMY  03/31/2020   Procedure: POLYPECTOMY;  Surgeon: Daneil Dolin,  MD;  Location: [facility];  Service: Endoscopy;;    Current Outpatient Medications  Medication Sig Dispense Refill   amLODipine (NORVASC) 10 MG tablet Take 1 tablet (10 mg total) by mouth daily. 30 tablet 12   apixaban (ELIQUIS) 5 MG TABS tablet Take 1 tablet (5 mg total) by mouth 2 (two) times daily. 60 tablet 6   atorvastatin (LIPITOR) 10 MG tablet Take 10 mg by mouth daily.     busPIRone (BUSPAR) 5 MG tablet Take 5 mg by mouth 2 (two) times daily.     Carboxymethylcellulose Sodium (ARTIFICIAL TEARS OP) Place 1 drop into both eyes daily in the afternoon.     DULoxetine (CYMBALTA) 30 MG capsule Take 30 mg by mouth daily.     fexofenadine (ALLEGRA) 180 MG tablet Take 180 mg by mouth daily as needed for allergies or rhinitis.     furosemide (LASIX) 20  MG tablet Take one tablet by mouth for 3 days and if gains 3-5 pounds take one tablet as needed 10 tablet 0   gabapentin (NEURONTIN) 300 MG capsule Take 300 mg by mouth 3 (three) times daily.     glimepiride (AMARYL) 4 MG tablet Take 4 mg by mouth in the morning and at bedtime.      hydrALAZINE (APRESOLINE) 50 MG tablet Take 50 mg by mouth 2 (two) times daily.     insulin aspart protamine - aspart (NOVOLOG MIX 70/30 FLEXPEN) (70-30) 100 UNIT/ML FlexPen Inject 30-32 Units into the skin See admin instructions. Inject 32 units into the skin in the morning and 30 units in the evening     JARDIANCE 10 MG TABS tablet Take 10 mg by mouth daily.     labetalol (NORMODYNE) 200 MG tablet Take 200 mg by mouth 2 (two) times daily.     LORazepam (ATIVAN) 0.5 MG tablet Take 0.5 mg by mouth daily as needed.     metFORMIN (GLUCOPHAGE) 1000 MG tablet Take 1,000 mg by mouth 2 (two) times daily with a meal.     metoprolol tartrate (LOPRESSOR) 25 MG tablet Take 1 tablet (25 mg total) by mouth 2 (two) times daily. 60 tablet 2   potassium chloride (KLOR-CON) 10 MEQ tablet Take one tablet by mouth for 3 days with the lasix as needed 10 tablet 0   No current  facility-administered medications for this visit.   Allergies:  Percocet [oxycodone-acetaminophen] and Penicillins   Social History: The patient  reports that she has never smoked. She has never used smokeless tobacco. She reports that she does not drink alcohol and does not use drugs.   Family History: The patient's family history includes CVA in her father; Cancer in her mother; Heart disease in her mother.   ROS:  Please see the history of present illness. Otherwise, complete review of systems is positive for none.  All other systems are reviewed and negative.   Physical Exam: VS:  BP (!) 92/58   Pulse 75   Ht 5\' 4"  (1.626 m)   Wt 217 lb 12.8 oz (98.8 kg)   LMP  (LMP Unknown)   SpO2 96%   BMI 37.39 kg/m , BMI Body mass index is 37.39 kg/m.  Wt Readings from Last 3 Encounters:  02/09/21 217 lb 12.8 oz (98.8 kg)  01/28/21 219 lb 9.6 oz (99.6 kg)  01/20/21 220 lb 12.8 oz (100.2 kg)    General: Obese patient appears comfortable at rest. Neck: Supple, no elevated JVP or carotid bruits, no thyromegaly. Lungs: Clear to auscultation, nonlabored breathing at rest. Cardiac: Irregularly irregular tachycardic  rhythm, no S3 or significant systolic murmur, no pericardial rub. Extremities: No pitting edema, distal pulses 2+. Skin: Warm and dry. Musculoskeletal: No kyphosis. Neuropsychiatric: Alert and oriented x3, affect grossly appropriate.  ECG: 01/28/2021 EKG atrial fibrillation with RVR rate of 101.  Initial heart rate on arrival was 88.  Recent Labwork: 01/27/2021: BUN 21; Creatinine, Ser 1.22; Potassium 4.4; Sodium 138     Component Value Date/Time   CHOL 258 (H) 07/28/2014 0045   TRIG 185 (H) 07/28/2014 0045   HDL 36 (L) 07/28/2014 0045   CHOLHDL 7.2 07/28/2014 0045   VLDL 37 07/28/2014 0045   LDLCALC 185 (H) 07/28/2014 0045    Other Studies Reviewed Today:   07/2014 Echo Study Conclusions  - Left ventricle: The cavity size was normal. Wall thickness was   increased  in a pattern of moderate  LVH. Systolic function was   normal. The estimated ejection fraction was in the range of 60%   to 65%. Wall motion was normal; there were no regional wall   motion abnormalities. - Left atrium: The atrium was mildly dilated. - Pericardium, extracardiac: A trivial pericardial effusion was   identified.     07/2014 cardiac catheterization   HEMODYNAMICS:  Aortic pressure 162/93 mmHg; LV pressure 163/23 mmHg; LVEDP 27 mmHg     ANGIOGRAPHIC DATA:   The left main coronary artery is normal.   The left anterior descending artery is large and wraps around the left ventricular apex. There is mid vessel eccentric 30% narrowing and distal vessel eccentric 60-70% narrowing. The large first diagonal contains focal mid vessel 50% narrowing..   The left circumflex artery is patent. 2 marginal branches arise from the circumflex. He can marginal is a dominant of the 2 vessels. The proximal obtuse marginal contains a 50% narrowing..   The right coronary artery is  dominant and widely patent.     LEFT VENTRICULOGRAM:  Left ventricular angiogram was done in the 30 RAO projection and revealed brisk symmetric contractility with EF 60%. Elevated end-diastolic pressures noted.      IMPRESSIONS:  1. Nonobstructive coronary artery disease involving the left anterior descending, the first diagonal, and the first obtuse marginal branch.   2. Normal left ventricular systolic function with EF of at least 60%, left ventricular hypertrophy, and evidence of diastolic heart failure (LVEDP greater than 23 mmHg )   POST CATH RECOMMENDATION:     1. Aggressive management of hypertension   2. Eligible for discharge when blood pressure is controlled.        Assessment and Plan:  1. New onset atrial fibrillation ([place])   2. Essential hypertension   3. Mixed hyperlipidemia   4. OSA (obstructive sleep apnea)   5. Weight gain       1. New onset atrial fibrillation (HCC) EKG today shows  atrial fibrillation with controlled rate of 75.  Continue metoprolol 25 mg p.o. twice daily.  I advised her last visit she could take a half a pill as needed for increased heart rate in addition to her 25 mg p.o. twice daily.  She verbalized understanding.  CHA2DS2-VASc score 3.  (HTN, DM, gender).  Continue metoprolol 25 mg p.o. twice daily and take 12.5 mg as needed for increased heart rate in addition.  Continue Eliquis 5 mg p.o. twice daily.  Cardioversion continues to be on hold until further follow-up with ophthalmology.  I advised her to let us know when she is cleared from ophthalmology standpoint to undergo cardioversion.  She states she will let us know.  2. Essential hypertension Blood pressure a little on the low side today.  Patient states she took her nighttime blood pressure medication late in the evening and took her a.m. blood pressure medications earlier today.  She believes this may have some bearing on her current blood pressure.  She is asymptomatic with this blood pressure.  Advised to make sure she spaces her blood pressure medications out appropriately to avoid lower blood pressures in the future.  She verbalizes understanding.  Continue amlodipine 10 mg daily, hydralazine 75 mg p.o. twice daily, labetalol 200 mg p.o. twice daily.  Olmesartan 20 mg daily.  3. Mixed hyperlipidemia Continue atorvastatin 10 mg daily.  4. OSA (obstructive sleep apnea) Patient states she would like to be reevaluated secondary to her sleep apnea.  Patient states she is seeing Dr. Brett Fairy  pulmonology in [place].  States she cannot tolerate the CPAP therapy.  She states Dr. Brett Fairy tells her there is a device called inspire which can be inserted and help with her sleep apnea.  She plans on pursuing that after cardioversion.  6. Weight gain She was started on Lasix 20 mg po daily x 3 days then 20 mg po prn for weight gain 3-5 lbs with K+ supplementation by Roderic Palau  NP at recent visit with atrial  fibrillation clinic.  Weight today is 217.  She is currently taking her Lasix 20 mg daily as needed for weight gain of 3 to 5 pounds.  Recent basic metabolic panel showed mild increase in creatinine at 1.22 with GFR 51.  Continue as needed Lasix for weight gain per above parameters.   Medication Adjustments/Labs and Tests Ordered: Current medicines are reviewed at length with the patient today.  Concerns regarding medicines are outlined above.   Disposition: Follow-up with Dr. Harl Bowie or APP  6 months  Signed, Levell July, NP 02/09/2021 10:21 AM    [facility] [place], [place], [place] [address] Phone: [phone]; Fax: [phone]

## 2021-02-09 ENCOUNTER — Ambulatory Visit (INDEPENDENT_AMBULATORY_CARE_PROVIDER_SITE_OTHER): Payer: 59 | Admitting: Family Medicine

## 2021-02-09 ENCOUNTER — Encounter: Payer: Self-pay | Admitting: Family Medicine

## 2021-02-09 VITALS — BP 92/58 | HR 75 | Ht 64.0 in | Wt 217.8 lb

## 2021-02-09 DIAGNOSIS — I10 Essential (primary) hypertension: Secondary | ICD-10-CM | POA: Diagnosis not present

## 2021-02-09 DIAGNOSIS — I4891 Unspecified atrial fibrillation: Secondary | ICD-10-CM | POA: Diagnosis not present

## 2021-02-09 DIAGNOSIS — R635 Abnormal weight gain: Secondary | ICD-10-CM

## 2021-02-09 DIAGNOSIS — G4733 Obstructive sleep apnea (adult) (pediatric): Secondary | ICD-10-CM

## 2021-02-09 DIAGNOSIS — E782 Mixed hyperlipidemia: Secondary | ICD-10-CM

## 2021-02-09 NOTE — Patient Instructions (Signed)
Medication Instructions:  Continue all current medications.   Labwork: none  Testing/Procedures: none  Follow-Up: 6 months   Any Other Special Instructions Will Be Listed Below (If Applicable).   If you need a refill on your cardiac medications before your next appointment, please call your pharmacy.  

## 2021-02-09 NOTE — Addendum Note (Signed)
Addended by: Laurine Blazer on: 02/09/2021 11:11 AM   Modules accepted: Orders

## 2021-03-26 ENCOUNTER — Institutional Professional Consult (permissible substitution): Payer: 59 | Admitting: Pulmonary Disease

## 2021-05-03 ENCOUNTER — Other Ambulatory Visit (HOSPITAL_COMMUNITY): Payer: Self-pay | Admitting: Nurse Practitioner

## 2021-05-21 ENCOUNTER — Institutional Professional Consult (permissible substitution): Payer: 59 | Admitting: Pulmonary Disease

## 2021-06-06 ENCOUNTER — Other Ambulatory Visit (HOSPITAL_COMMUNITY): Payer: Self-pay | Admitting: Family Medicine

## 2021-07-23 ENCOUNTER — Encounter: Payer: Self-pay | Admitting: Pulmonary Disease

## 2021-07-23 ENCOUNTER — Ambulatory Visit (INDEPENDENT_AMBULATORY_CARE_PROVIDER_SITE_OTHER): Payer: Medicare Other | Admitting: Pulmonary Disease

## 2021-07-23 ENCOUNTER — Other Ambulatory Visit: Payer: Self-pay

## 2021-07-23 VITALS — BP 140/88 | HR 86 | Temp 98.0°F | Ht 64.0 in | Wt 222.1 lb

## 2021-07-23 DIAGNOSIS — Z789 Other specified health status: Secondary | ICD-10-CM | POA: Diagnosis not present

## 2021-07-23 DIAGNOSIS — G4733 Obstructive sleep apnea (adult) (pediatric): Secondary | ICD-10-CM | POA: Diagnosis not present

## 2021-07-23 DIAGNOSIS — E669 Obesity, unspecified: Secondary | ICD-10-CM

## 2021-07-23 DIAGNOSIS — G4730 Sleep apnea, unspecified: Secondary | ICD-10-CM

## 2021-07-23 NOTE — Patient Instructions (Signed)
Will have you sign a release form to get a copy of your sleep study from [place] can contact [facility] at [place] with your dentist about getting an oral appliance to treat obstructive sleep apnea, and call the office if you need a referral to get this set up  Follow up in 6 months

## 2021-07-23 NOTE — Progress Notes (Signed)
Pulmonary, Critical Care, and Sleep Medicine  Chief Complaint  Patient presents with   Consult    Patient ref by Levell July NP.  Already has cpap sleep study done 3-4 years ago. Does not use cpap    Past Surgical History:  She  has a past surgical history that includes left heart catheterization with coronary angiogram (N/A, 07/29/2014); Colonoscopy with propofol (N/A, 03/31/2020); and polypectomy (03/31/2020).  Past Medical History:  Anxiety, Coronary artery disease, Carpal tunnel, Depression, Hyperlipidemia, Back pain, DM type 2 with neuropathy, Atrial fibrillation, Retinal detachment  Constitutional:  BP 140/88 (BP Location: Left Arm, Patient Position: Sitting)    Pulse 86    Temp 98 F (36.7 C)    Ht 5\' 4"  (1.626 m)    Wt 222 lb 1.9 oz (100.8 kg)    LMP  (LMP Unknown)    SpO2 98%    BMI 38.13 kg/m   Brief Summary:  Andrea Grant is a 60 y.o. female with obstructive sleep apnea.      Subjective:   She had a sleep study several years ago at [facility]  She was told she had sleep apnea and started on CPAP.  She could never adjust to using this.  There pressure was too much and masks would never stay on.  She stopped using CPAP.  She continues to have trouble with her sleep and feeling sleepy during the day.  She snores and wakes up hearing herself snore.  She also wakes up feeling her heart racing.  She is a restless sleeper.  She has trouble staying awake when she is watching TV or reading.  She goes to sleep at 11 pm.  She falls asleep 30 minutes.  She wakes up 2 times to use the bathroom.  She gets out of bed at 830 am.  She feels tired in the morning.  She denies morning headache.  She does not use anything to help her fall sleep or stay awake.  She denies sleep walking, sleep talking, bruxism, or nightmares.  There is no history of restless legs.  She denies sleep hallucinations, sleep paralysis, or cataplexy.  The Epworth score is 14  out of 24.   Physical Exam:   Appearance - well kempt   ENMT - no sinus tenderness, no oral exudate, no LAN, Mallampati 4 airway, no stridor  Respiratory - equal breath sounds bilaterally, no wheezing or rales  CV - irregular  Ext - no clubbing, no edema  Skin - no rashes  Psych - normal mood and affect   Sleep Tests:    Cardiac Tests:  Echo 07/28/14 >> EF 60 to 65%, mod LVH, mild LA dilation  Social History:  She  reports that she has never smoked. She has never used smokeless tobacco. She reports that she does not drink alcohol and does not use drugs.  Family History:  Her family history includes CVA in her father; Cancer in her mother; Heart disease in her mother.    Discussion:  She has snoring, sleep disruption, apnea, and daytime sleepiness.  She has history of history of coronary artery disease, atrial fibrillation, anxiety and depression.  She reports prior history of obstructive sleep apnea, and almost certainly still has significant sleep apnea.  She was tried previously on CPAP but wasn't able to tolerate CPAP therapy.  She would not want to revisit CPAP therapy use.  Her BMI is currently 38.13 which would preclude her from being a  candidate for an Inspire device at this time.  We reviewed other surgical options for sleep apnea and she is reluctant to consider these at this time also.  She might be a candidate for an oral appliance.  Assessment/Plan:   Obstructive sleep apnea. - she will check with her dentist about getting an oral appliance made - she will contact the office if she needs a referral to get an oral appliance - had her sign release form to get a copy of her sleep study from [place] fibrillation. - followed by Dr. Kerry Hough with [facility]  Obesity. - discussed how weight can impact sleep and risk for sleep disordered breathing - discussed options to assist with weight loss: combination of diet modification,  cardiovascular and strength training exercises - provided her contact information for [facility]  Cardiovascular risk. - had an extensive discussion regarding the adverse health consequences related to untreated sleep disordered breathing - specifically discussed the risks for hypertension, coronary artery disease, cardiac dysrhythmias, cerebrovascular disease, and diabetes - lifestyle modification discussed  Safe driving practices. - discussed how sleep disruption can increase risk of accidents, particularly when driving - safe driving practices were discussed  Therapies for obstructive sleep apnea. - if the sleep study shows significant sleep apnea, then various therapies for treatment were reviewed: CPAP, oral appliance, and surgical interventions  Time Spent Involved in Patient Care on Day of Examination:  48 minutes  Follow up:   Patient Instructions  Will have you sign a release form to get a copy of your sleep study from [place] can contact [facility] [place] with your dentist about getting an oral appliance to treat obstructive sleep apnea, and call the office if you need a referral to get this set up  Follow up in 6 months  Medication List:   Allergies as of 07/23/2021       Reactions   Percocet [oxycodone-acetaminophen] Itching   Per pt, tolerates Vicodin and APAP fine   Penicillins Hives, Rash        Medication List        Accurate as of July 23, 2021 11:05 AM. If you have any questions, ask your nurse or doctor.          amLODipine 10 MG tablet Commonly known as: NORVASC Take 1 tablet (10 mg total) by mouth daily.   ARTIFICIAL TEARS OP Place 1 drop into both eyes daily in the afternoon.   atorvastatin 10 MG tablet Commonly known as: LIPITOR Take 10 mg by mouth daily.   busPIRone 5 MG tablet Commonly known as: BUSPAR Take 5 mg by mouth 2 (two) times daily.    DULoxetine 30 MG capsule Commonly known as: CYMBALTA Take 30 mg by mouth daily.   Eliquis 5 MG Tabs tablet Generic drug: apixaban Take 1 tablet (5 mg total) by mouth 2 (two) times daily.   fexofenadine 180 MG tablet Commonly known as: ALLEGRA Take 180 mg by mouth daily as needed for allergies or rhinitis.   furosemide 20 MG tablet Commonly known as: Lasix Take one tablet by mouth for 3 days and if gains 3-5 pounds take one tablet as needed   gabapentin 300 MG capsule Commonly known as: NEURONTIN Take 300 mg by mouth 3 (three) times daily.   glimepiride 4 MG tablet Commonly known as: AMARYL Take 4 mg by mouth in the morning and at bedtime.  hydrALAZINE 50 MG tablet Commonly known as: APRESOLINE Take 50 mg by mouth 2 (two) times daily.   Jardiance 10 MG Tabs tablet Generic drug: empagliflozin Take 10 mg by mouth daily.   labetalol 200 MG tablet Commonly known as: NORMODYNE Take 200 mg by mouth 2 (two) times daily.   LORazepam 0.5 MG tablet Commonly known as: ATIVAN Take 0.5 mg by mouth daily as needed.   metFORMIN 1000 MG tablet Commonly known as: GLUCOPHAGE Take 1,000 mg by mouth 2 (two) times daily with a meal.   metoprolol tartrate 25 MG tablet Commonly known as: LOPRESSOR Take 1 tablet by mouth twice daily   NovoLOG Mix 70/30 FlexPen (70-30) 100 UNIT/ML FlexPen Generic drug: insulin aspart protamine - aspart Inject 30-32 Units into the skin See admin instructions. Inject 32 units into the skin in the morning and 30 units in the evening   potassium chloride 10 MEQ tablet Commonly known as: KLOR-CON M Take one tablet by mouth for 3 days with the lasix as needed        Signature:  Chesley Mires, MD [place] Pager - [phone] 07/23/2021, 11:05 AM

## 2021-08-03 DIAGNOSIS — E1165 Type 2 diabetes mellitus with hyperglycemia: Secondary | ICD-10-CM | POA: Diagnosis not present

## 2021-08-03 DIAGNOSIS — Z299 Encounter for prophylactic measures, unspecified: Secondary | ICD-10-CM | POA: Diagnosis not present

## 2021-08-03 DIAGNOSIS — E1140 Type 2 diabetes mellitus with diabetic neuropathy, unspecified: Secondary | ICD-10-CM | POA: Diagnosis not present

## 2021-08-03 DIAGNOSIS — I10 Essential (primary) hypertension: Secondary | ICD-10-CM | POA: Diagnosis not present

## 2021-08-03 DIAGNOSIS — G5603 Carpal tunnel syndrome, bilateral upper limbs: Secondary | ICD-10-CM | POA: Diagnosis not present

## 2021-08-06 DIAGNOSIS — Z7189 Other specified counseling: Secondary | ICD-10-CM | POA: Diagnosis not present

## 2021-08-06 DIAGNOSIS — Z299 Encounter for prophylactic measures, unspecified: Secondary | ICD-10-CM | POA: Diagnosis not present

## 2021-08-06 DIAGNOSIS — Z789 Other specified health status: Secondary | ICD-10-CM | POA: Diagnosis not present

## 2021-08-06 DIAGNOSIS — Z0000 Encounter for general adult medical examination without abnormal findings: Secondary | ICD-10-CM | POA: Diagnosis not present

## 2021-08-06 DIAGNOSIS — E7800 Pure hypercholesterolemia, unspecified: Secondary | ICD-10-CM | POA: Diagnosis not present

## 2021-08-06 DIAGNOSIS — Z79899 Other long term (current) drug therapy: Secondary | ICD-10-CM | POA: Diagnosis not present

## 2021-08-06 DIAGNOSIS — I10 Essential (primary) hypertension: Secondary | ICD-10-CM | POA: Diagnosis not present

## 2021-08-09 DIAGNOSIS — E113593 Type 2 diabetes mellitus with proliferative diabetic retinopathy without macular edema, bilateral: Secondary | ICD-10-CM | POA: Diagnosis not present

## 2021-08-09 DIAGNOSIS — H3582 Retinal ischemia: Secondary | ICD-10-CM | POA: Diagnosis not present

## 2021-08-09 DIAGNOSIS — H43822 Vitreomacular adhesion, left eye: Secondary | ICD-10-CM | POA: Diagnosis not present

## 2021-08-09 DIAGNOSIS — H2512 Age-related nuclear cataract, left eye: Secondary | ICD-10-CM | POA: Diagnosis not present

## 2021-08-19 ENCOUNTER — Other Ambulatory Visit: Payer: Self-pay | Admitting: *Deleted

## 2021-08-19 MED ORDER — APIXABAN 5 MG PO TABS: 5.0000 mg | ORAL_TABLET | Freq: Two times a day (BID) | ORAL | 6 refills | Status: DC

## 2021-08-19 NOTE — Telephone Encounter (Signed)
Prescription refill request for Eliquis received. Indication: Atrial Fib Last office visit: 02/09/21  A Quinn FNP Scr: 1.13 on 08/06/21 Age: 61 Weight: 98.8kg  Based on above findings Eliquis 5mg  twice daily is the appropriate dose.  Refill approved.

## 2021-09-23 DIAGNOSIS — Z961 Presence of intraocular lens: Secondary | ICD-10-CM | POA: Diagnosis not present

## 2021-09-23 DIAGNOSIS — Z7984 Long term (current) use of oral hypoglycemic drugs: Secondary | ICD-10-CM | POA: Diagnosis not present

## 2021-09-23 DIAGNOSIS — Z794 Long term (current) use of insulin: Secondary | ICD-10-CM | POA: Diagnosis not present

## 2021-09-23 DIAGNOSIS — H2512 Age-related nuclear cataract, left eye: Secondary | ICD-10-CM | POA: Diagnosis not present

## 2021-09-23 DIAGNOSIS — E119 Type 2 diabetes mellitus without complications: Secondary | ICD-10-CM | POA: Diagnosis not present

## 2021-09-23 DIAGNOSIS — E113553 Type 2 diabetes mellitus with stable proliferative diabetic retinopathy, bilateral: Secondary | ICD-10-CM | POA: Diagnosis not present

## 2021-10-07 ENCOUNTER — Telehealth: Payer: Self-pay | Admitting: Pulmonary Disease

## 2021-10-07 NOTE — Telephone Encounter (Signed)
Her dentist should be the one to decide about the type of mandibular advancement device that is needed.  If her dentist is unfamiliar with these types of devices, then she might be better being referred to a dentist that specializes in making oral appliances for obstructive sleep apnea.  If she would like a referral, then let me know. ?

## 2021-10-07 NOTE — Telephone Encounter (Signed)
Called patient and she is stating that her dentist is needing to know what kind of oral device is required for her to treat her OSA.  Told patient I would message Dr Halford Chessman and get better clarification. ? ?Please advise Dr Halford Chessman  ?

## 2021-10-08 NOTE — Telephone Encounter (Signed)
Called pt and there was no answer-LMTCB °

## 2021-10-12 NOTE — Telephone Encounter (Signed)
Called and spoke to pt. Informed her of the recs per Dr. Halford Chessman. Pt verbalized understanding and will speak with her dentist and call back.  ?

## 2021-10-12 NOTE — Telephone Encounter (Signed)
Patient is returning phone call. Patient phone number is [phone]. ?

## 2021-10-12 NOTE — Telephone Encounter (Signed)
Lm for patient.  

## 2021-10-14 ENCOUNTER — Other Ambulatory Visit: Payer: Self-pay | Admitting: Internal Medicine

## 2021-10-14 NOTE — Telephone Encounter (Signed)
Referral placed to Dentist.  ?Patient is aware and voiced her understanding.  ?Nothing further needed.  ? ?

## 2021-12-06 DIAGNOSIS — H3582 Retinal ischemia: Secondary | ICD-10-CM | POA: Diagnosis not present

## 2021-12-06 DIAGNOSIS — E113511 Type 2 diabetes mellitus with proliferative diabetic retinopathy with macular edema, right eye: Secondary | ICD-10-CM | POA: Diagnosis not present

## 2021-12-06 DIAGNOSIS — H43822 Vitreomacular adhesion, left eye: Secondary | ICD-10-CM | POA: Diagnosis not present

## 2021-12-06 DIAGNOSIS — E113592 Type 2 diabetes mellitus with proliferative diabetic retinopathy without macular edema, left eye: Secondary | ICD-10-CM | POA: Diagnosis not present

## 2021-12-09 ENCOUNTER — Other Ambulatory Visit: Payer: Self-pay

## 2021-12-20 DIAGNOSIS — S39012A Strain of muscle, fascia and tendon of lower back, initial encounter: Secondary | ICD-10-CM | POA: Diagnosis not present

## 2021-12-20 DIAGNOSIS — Z880 Allergy status to penicillin: Secondary | ICD-10-CM | POA: Diagnosis not present

## 2021-12-20 DIAGNOSIS — M50322 Other cervical disc degeneration at C5-C6 level: Secondary | ICD-10-CM | POA: Diagnosis not present

## 2021-12-20 DIAGNOSIS — M2578 Osteophyte, vertebrae: Secondary | ICD-10-CM | POA: Diagnosis not present

## 2021-12-20 DIAGNOSIS — Z041 Encounter for examination and observation following transport accident: Secondary | ICD-10-CM | POA: Diagnosis not present

## 2021-12-20 DIAGNOSIS — S161XXA Strain of muscle, fascia and tendon at neck level, initial encounter: Secondary | ICD-10-CM | POA: Diagnosis not present

## 2021-12-20 DIAGNOSIS — Z885 Allergy status to narcotic agent status: Secondary | ICD-10-CM | POA: Diagnosis not present

## 2021-12-20 DIAGNOSIS — M47816 Spondylosis without myelopathy or radiculopathy, lumbar region: Secondary | ICD-10-CM | POA: Diagnosis not present

## 2021-12-30 DIAGNOSIS — E1165 Type 2 diabetes mellitus with hyperglycemia: Secondary | ICD-10-CM | POA: Diagnosis not present

## 2021-12-30 DIAGNOSIS — Z299 Encounter for prophylactic measures, unspecified: Secondary | ICD-10-CM | POA: Diagnosis not present

## 2021-12-30 DIAGNOSIS — N1831 Chronic kidney disease, stage 3a: Secondary | ICD-10-CM | POA: Diagnosis not present

## 2021-12-30 DIAGNOSIS — I10 Essential (primary) hypertension: Secondary | ICD-10-CM | POA: Diagnosis not present

## 2021-12-31 DIAGNOSIS — E1165 Type 2 diabetes mellitus with hyperglycemia: Secondary | ICD-10-CM | POA: Diagnosis not present

## 2021-12-31 DIAGNOSIS — S161XXA Strain of muscle, fascia and tendon at neck level, initial encounter: Secondary | ICD-10-CM | POA: Diagnosis not present

## 2021-12-31 DIAGNOSIS — N1831 Chronic kidney disease, stage 3a: Secondary | ICD-10-CM | POA: Diagnosis not present

## 2021-12-31 DIAGNOSIS — Z299 Encounter for prophylactic measures, unspecified: Secondary | ICD-10-CM | POA: Diagnosis not present

## 2021-12-31 DIAGNOSIS — I10 Essential (primary) hypertension: Secondary | ICD-10-CM | POA: Diagnosis not present

## 2022-01-11 DIAGNOSIS — S134XXA Sprain of ligaments of cervical spine, initial encounter: Secondary | ICD-10-CM | POA: Diagnosis not present

## 2022-01-12 ENCOUNTER — Ambulatory Visit
Admission: RE | Admit: 2022-01-12 | Discharge: 2022-01-12 | Disposition: A | Discharge status: Home / Self Care | Payer: Medicare Other | Source: Ambulatory Visit | Attending: Internal Medicine | Admitting: Internal Medicine

## 2022-01-12 DIAGNOSIS — Z1231 Encounter for screening mammogram for malignant neoplasm of breast: Secondary | ICD-10-CM

## 2022-01-21 DIAGNOSIS — S134XXA Sprain of ligaments of cervical spine, initial encounter: Secondary | ICD-10-CM | POA: Diagnosis not present

## 2022-01-24 ENCOUNTER — Telehealth: Payer: Self-pay | Admitting: Cardiology

## 2022-01-24 NOTE — Telephone Encounter (Signed)
*  STAT* If patient is at the pharmacy, call can be transferred to refill team.   1. Which medications need to be refilled? (please list name of each medication and dose if known)  metoprolol tartrate (LOPRESSOR) 25 MG tablet  2. Which pharmacy/location (including street and city if local pharmacy) is medication to be sent to? [facility] [address] [place] - [address]  3. Do they need a 30 day or 90 day supply?   Patient is following up very concerned regarding refill. States she took her last tablet yesterday. She also went ahead and scheduled for 9/06 with Ronie Spies, PA.

## 2022-01-24 NOTE — Telephone Encounter (Signed)
Pass due for follow up in clinic Left message to call for an appointment  Medications need review prior to refilling lopressor

## 2022-01-25 MED ORDER — METOPROLOL TARTRATE 25 MG PO TABS: 25.0000 mg | ORAL_TABLET | Freq: Two times a day (BID) | ORAL | 3 refills | Status: DC

## 2022-01-25 NOTE — Telephone Encounter (Signed)
Pt came into the office stating that she's needing to be on the Metoprolol- .  states that her heart rate was racing at work today and she's having discomfort in her chest and that she's feeling bad.   Her heart was skipping a beat and that she's in afib.  Please call [phone]

## 2022-01-25 NOTE — Telephone Encounter (Signed)
Reports working today at [employer] [facility] and felt fatigue and SOB Says she ran out of metoprolol 2-3 days ago and symptoms started at that time Reports active chest pressure rated 5/10 with SOB and fatigue Reports checking HR at work today and it was 101 and felt like it was skipping Advised to go to the ED for an evaluation Verbalized understanding of plan

## 2022-01-27 DIAGNOSIS — S134XXA Sprain of ligaments of cervical spine, initial encounter: Secondary | ICD-10-CM | POA: Diagnosis not present

## 2022-02-02 DIAGNOSIS — S134XXA Sprain of ligaments of cervical spine, initial encounter: Secondary | ICD-10-CM | POA: Diagnosis not present

## 2022-03-03 DIAGNOSIS — S134XXA Sprain of ligaments of cervical spine, initial encounter: Secondary | ICD-10-CM | POA: Diagnosis not present

## 2022-03-11 DIAGNOSIS — S134XXA Sprain of ligaments of cervical spine, initial encounter: Secondary | ICD-10-CM | POA: Diagnosis not present

## 2022-03-16 DIAGNOSIS — S134XXA Sprain of ligaments of cervical spine, initial encounter: Secondary | ICD-10-CM | POA: Diagnosis not present

## 2022-03-18 DIAGNOSIS — S134XXA Sprain of ligaments of cervical spine, initial encounter: Secondary | ICD-10-CM | POA: Diagnosis not present

## 2022-03-24 DIAGNOSIS — S134XXA Sprain of ligaments of cervical spine, initial encounter: Secondary | ICD-10-CM | POA: Diagnosis not present

## 2022-03-28 DIAGNOSIS — S134XXA Sprain of ligaments of cervical spine, initial encounter: Secondary | ICD-10-CM | POA: Diagnosis not present

## 2022-03-31 DIAGNOSIS — S134XXA Sprain of ligaments of cervical spine, initial encounter: Secondary | ICD-10-CM | POA: Diagnosis not present

## 2022-04-04 ENCOUNTER — Encounter: Payer: Self-pay | Admitting: Physician Assistant

## 2022-04-04 NOTE — Progress Notes (Deleted)
Cardiology Office Note    Date:  04/04/2022   ID:  Andrea Grant, DOB May 11, 1961, MRN [id number]  PCP:  Glenda Chroman, MD  Cardiologist:  Carlyle Dolly, MD  Electrophysiologist:  None   Chief Complaint: ***  History of Present Illness:   Andrea Grant is a 61 y.o. female with history of nonobstructive CAD, anxiety, depression, HLD, OSA (followed by neurology), DM with neuropathy who presents for followup. She was previously evaluated for chest pain. Cath 07/2014 showed findings below with nonobstructive CAD with 30% mLAD, 60-70% dLAD, 50% D1, 50% prox OM. 2D echo 07/2014 EF 60-65%, no RWMA, mild LAE, trivial pericardial effusion. In 12/2020 she was found to be in new onset atrial fib. She was seen both by the afib clinic and Levell July, NP. She was treated with rate control initially with deferral of cardioversion as she as having some ophthalmic issues and ultimately required surgery for detached retina. At last OV 01/2021, she was asked to notify cardiology when OK to undergo cardioversion from ophthalmology standpoint but has not had follow-up since that time. She had also been started on low dose Lasix. She has not had a follow-up echo since 2015.  No recent labs - needs updated everything and echo  Persistent atrial fibrillation CAD, HLD Essential HTN OSA   Labwork independently reviewed: 12/2020 K 4.4, Cr 1.22   Cardiology Studies:   Studies reviewed are outlined and summarized above. Reports included below if pertinent.   2D echo 07/2014   - Left ventricle: The cavity size was normal. Wall thickness was    increased in a pattern of moderate LVH. Systolic function was    normal. The estimated ejection fraction was in the range of 60%    to 65%. Wall motion was normal; there were no regional wall    motion abnormalities.  - Left atrium: The atrium was mildly dilated.  - Pericardium, extracardiac: A trivial pericardial effusion was    identified.    07/2014  Cath Procedure Date: 07/29/2014 Referring Physician: Octavia Bruckner, M.D. Primary Cardiologist:: Same   INDICATIONS: Vague chest discomfort with normal elevation in troponin and a 61 year old diabetic was severely elevated blood pressure. Coronary angiography is being done as a direct approach to rule out significant coronary disease.   PROCEDURE: 1. Left heart catheterization; 2. Coronary angiography; 3. Left ventriculography   CONSENT:  The risks, benefits, and details of the procedure were explained in detail to the patient. Risks including death, stroke, heart attack, kidney injury, allergy, limb ischemia, bleeding and radiation injury were discussed.  The patient verbalized understanding and wanted to proceed.  Informed written consent was obtained.   PROCEDURE TECHNIQUE:  After Xylocaine anesthesia a 5 French Slender sheath was placed in the right radial artery with an angiocath and the modified Seldinger technique.  Coronary angiography was done using a 5 F JR4 catheter.  Left ventriculography was done using the JR 4 catheter and hand injection.    Hemostasis was achieved with a wrist band at 14 cc of air   CONTRAST:  Total of 75 cc.   COMPLICATIONS:  None     HEMODYNAMICS:  Aortic pressure 162/93 mmHg; LV pressure 163/23 mmHg; LVEDP 27 mmHg    ANGIOGRAPHIC DATA:   The left main coronary artery is normal.   The left anterior descending artery is large and wraps around the left ventricular apex. There is mid vessel eccentric 30% narrowing and distal vessel eccentric 60-70% narrowing. The large first  diagonal contains focal mid vessel 50% narrowing..   The left circumflex artery is patent. 2 marginal branches arise from the circumflex. He can marginal is a dominant of the 2 vessels. The proximal obtuse marginal contains a 50% narrowing..   The right coronary artery is  dominant and widely patent.     LEFT VENTRICULOGRAM:  Left ventricular angiogram was done in the 30 RAO projection  and revealed brisk symmetric contractility with EF 60%. Elevated end-diastolic pressures noted.      IMPRESSIONS:  1. Nonobstructive coronary artery disease involving the left anterior descending, the first diagonal, and the first obtuse marginal branch.   2. Normal left ventricular systolic function with EF of at least 60%, left ventricular hypertrophy, and evidence of diastolic heart failure (LVEDP greater than 23 mmHg )   RECOMMENDATION:     1. Aggressive management of hypertension  2. Eligible for discharge when blood pressure is controlled.       Past Medical History:  Diagnosis Date   Anxiety disorder    CAD (coronary artery disease)    Cath 07/2014 with nonobstructive CAD mild to moderate, echo 07/2014 LVEF 60 to 65%.   Carpal tunnel syndrome, right    Cyst of skin    Depression    Hypercholesteremia    Hypertensive heart disease 07/27/2014   Insomnia    Lumbar radiculopathy    OSA (obstructive sleep apnea)    using CPAP sometimes   Retinal detachment    Type 2 diabetes mellitus with peripheral neuropathy ([place]) 07/27/2014    Past Surgical History:  Procedure Laterality Date   COLONOSCOPY WITH PROPOFOL N/A 03/31/2020   Procedure: COLONOSCOPY WITH PROPOFOL;  Surgeon: Daneil Dolin, MD;  Location: [facility];  Service: Endoscopy;  Laterality: N/A;  10:00am, pt can't come earlier, transportation   [place] N/A 07/29/2014   Procedure: LEFT HEART CATHETERIZATION WITH CORONARY ANGIOGRAM;  Surgeon: Sinclair Grooms, MD;  Location: [facility] CATH LAB;  Service: Cardiovascular;  Laterality: N/A;   POLYPECTOMY  03/31/2020   Procedure: POLYPECTOMY;  Surgeon: Daneil Dolin, MD;  Location: [facility];  Service: Endoscopy;;    Current Medications: No outpatient medications have been marked as taking for the 04/06/22 encounter (Appointment) with Charlie Pitter, PA-C.   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  Tobacco Use   Smoking status: Never   Smokeless tobacco: Never  Substance and Sexual Activity   Alcohol use: No    Alcohol/week: 0.0 standard drinks of alcohol   Drug use: No   Sexual activity: Never    Birth control/protection: Abstinence  Other Topics Concern   Not on file  Social History Narrative   Lives with 2 sons   Social Determinants of Health   Financial Resource Strain: Not on file  Food Insecurity: Not on file  Transportation Needs: Not on file  Physical Activity: Not on file  Stress: Not on file  Social Connections: Not on file     Family History:  The patient's ***family history includes CVA in her father; Cancer in her mother; Heart disease in her mother. There is no history of Colon cancer.  ROS:  Please see the history of present illness. Otherwise, review of systems is positive for ***.  All other systems are reviewed and otherwise negative.    EKG(s)/Additional Labs   EKG:  EKG is ordered today, personally reviewed, demonstrating ***  Recent Labs: No results found for requested labs within last 365 days.  Recent Lipid Panel    Component Value Date/Time   CHOL 258 (H) 07/28/2014 0045   TRIG 185 (H) 07/28/2014 0045   HDL 36 (L) 07/28/2014 0045   CHOLHDL 7.2 07/28/2014 0045   VLDL 37 07/28/2014 0045   LDLCALC 185 (H) 07/28/2014 0045    PHYSICAL EXAM:    VS:  LMP  (LMP Unknown)   BMI: There is no height or weight on file to calculate BMI.  GEN: Well nourished, well developed female in no acute distress HEENT: normocephalic, atraumatic Neck: no JVD, carotid bruits, or masses Cardiac: ***RRR; no murmurs, rubs, or gallops, no edema  Respiratory:  clear to auscultation bilaterally, normal work of breathing GI:  soft, nontender, nondistended, + BS MS: no deformity or atrophy Skin: warm and dry, no rash Neuro:  Alert and Oriented x 3, Strength and sensation are intact, follows commands Psych: euthymic mood, full affect  Wt Readings from Last 3 Encounters:  07/23/21 222 lb 1.9 oz (100.8 kg)  02/09/21 217 lb 12.8 oz (98.8 kg)  01/28/21 219 lb 9.6 oz (99.6 kg)     ASSESSMENT & PLAN:   ***     Disposition: F/u with ***   Medication Adjustments/Labs and Tests Ordered: Current medicines are reviewed at length with the patient today.  Concerns regarding medicines are outlined above. Medication changes, Labs and Tests ordered today are summarized above and listed in the Patient Instructions accessible in Encounters.    Signed, Charlie Pitter, PA-C  04/04/2022 10:15 AM    [facility] Location in [place]. [place], [place] [address] Ph: [phone]; Fax [phone]

## 2022-04-06 ENCOUNTER — Ambulatory Visit: Payer: Medicaid Other | Admitting: Physician Assistant

## 2022-04-06 DIAGNOSIS — E785 Hyperlipidemia, unspecified: Secondary | ICD-10-CM

## 2022-04-06 DIAGNOSIS — I4819 Other persistent atrial fibrillation: Secondary | ICD-10-CM

## 2022-04-06 DIAGNOSIS — I10 Essential (primary) hypertension: Secondary | ICD-10-CM

## 2022-04-06 DIAGNOSIS — G4733 Obstructive sleep apnea (adult) (pediatric): Secondary | ICD-10-CM

## 2022-04-06 DIAGNOSIS — I2510 Atherosclerotic heart disease of native coronary artery without angina pectoris: Secondary | ICD-10-CM

## 2022-04-06 DIAGNOSIS — S134XXA Sprain of ligaments of cervical spine, initial encounter: Secondary | ICD-10-CM | POA: Diagnosis not present

## 2022-04-07 ENCOUNTER — Other Ambulatory Visit: Payer: Self-pay | Admitting: Cardiology

## 2022-04-07 DIAGNOSIS — S134XXA Sprain of ligaments of cervical spine, initial encounter: Secondary | ICD-10-CM | POA: Diagnosis not present

## 2022-04-07 NOTE — Telephone Encounter (Signed)
Prescription refill request for Eliquis received. Indication: AF Last office visit: 02/09/21  Lawanda Cousins NP Scr: 1.13 on 08/06/21 Age: 61 Weight: 98.8kg  Based on above findings Eliquis '5mg'$  twice daily is the appropriate dose.  Refill approved.

## 2022-04-08 ENCOUNTER — Encounter: Payer: Self-pay | Admitting: Physician Assistant

## 2022-04-13 DIAGNOSIS — S134XXA Sprain of ligaments of cervical spine, initial encounter: Secondary | ICD-10-CM | POA: Diagnosis not present

## 2022-04-15 DIAGNOSIS — I152 Hypertension secondary to endocrine disorders: Secondary | ICD-10-CM | POA: Diagnosis not present

## 2022-04-15 DIAGNOSIS — I10 Essential (primary) hypertension: Secondary | ICD-10-CM | POA: Diagnosis not present

## 2022-04-15 DIAGNOSIS — Z789 Other specified health status: Secondary | ICD-10-CM | POA: Diagnosis not present

## 2022-04-15 DIAGNOSIS — M542 Cervicalgia: Secondary | ICD-10-CM | POA: Diagnosis not present

## 2022-04-15 DIAGNOSIS — S134XXA Sprain of ligaments of cervical spine, initial encounter: Secondary | ICD-10-CM | POA: Diagnosis not present

## 2022-04-15 DIAGNOSIS — E1159 Type 2 diabetes mellitus with other circulatory complications: Secondary | ICD-10-CM | POA: Diagnosis not present

## 2022-04-15 DIAGNOSIS — Z299 Encounter for prophylactic measures, unspecified: Secondary | ICD-10-CM | POA: Diagnosis not present

## 2022-04-20 DIAGNOSIS — S134XXA Sprain of ligaments of cervical spine, initial encounter: Secondary | ICD-10-CM | POA: Diagnosis not present

## 2022-04-25 DIAGNOSIS — H35033 Hypertensive retinopathy, bilateral: Secondary | ICD-10-CM | POA: Diagnosis not present

## 2022-04-25 DIAGNOSIS — H43822 Vitreomacular adhesion, left eye: Secondary | ICD-10-CM | POA: Diagnosis not present

## 2022-04-25 DIAGNOSIS — H3582 Retinal ischemia: Secondary | ICD-10-CM | POA: Diagnosis not present

## 2022-04-25 DIAGNOSIS — E113593 Type 2 diabetes mellitus with proliferative diabetic retinopathy without macular edema, bilateral: Secondary | ICD-10-CM | POA: Diagnosis not present

## 2022-05-04 DIAGNOSIS — S134XXA Sprain of ligaments of cervical spine, initial encounter: Secondary | ICD-10-CM | POA: Diagnosis not present

## 2022-05-06 DIAGNOSIS — S134XXA Sprain of ligaments of cervical spine, initial encounter: Secondary | ICD-10-CM | POA: Diagnosis not present

## 2022-05-11 DIAGNOSIS — S134XXA Sprain of ligaments of cervical spine, initial encounter: Secondary | ICD-10-CM | POA: Diagnosis not present

## 2022-05-13 DIAGNOSIS — S134XXA Sprain of ligaments of cervical spine, initial encounter: Secondary | ICD-10-CM | POA: Diagnosis not present

## 2022-05-16 ENCOUNTER — Ambulatory Visit: Payer: Medicare Other | Attending: Medical | Admitting: Medical

## 2022-05-16 ENCOUNTER — Encounter: Payer: Self-pay | Admitting: *Deleted

## 2022-05-16 ENCOUNTER — Encounter: Payer: Self-pay | Admitting: Medical

## 2022-05-16 VITALS — BP 170/100 | HR 87 | Ht 64.0 in | Wt 219.0 lb

## 2022-05-16 DIAGNOSIS — E782 Mixed hyperlipidemia: Secondary | ICD-10-CM

## 2022-05-16 DIAGNOSIS — I2510 Atherosclerotic heart disease of native coronary artery without angina pectoris: Secondary | ICD-10-CM

## 2022-05-16 DIAGNOSIS — I10 Essential (primary) hypertension: Secondary | ICD-10-CM

## 2022-05-16 DIAGNOSIS — I4811 Longstanding persistent atrial fibrillation: Secondary | ICD-10-CM

## 2022-05-16 DIAGNOSIS — G4733 Obstructive sleep apnea (adult) (pediatric): Secondary | ICD-10-CM | POA: Diagnosis not present

## 2022-05-16 NOTE — Addendum Note (Signed)
Addended by: Levonne Hubert on: 05/16/2022 04:09 PM   Modules accepted: Orders

## 2022-05-16 NOTE — Patient Instructions (Signed)
Andrea Grant  05/16/2022     '@PREFPERIOPPHARMACY'$ @   Your procedure is scheduled on  05/20/2022.   Report to [facility] at  [facility]   Call this number if you have problems the morning of surgery:  [phone]  If you experience any cold or flu symptoms such as cough, fever, chills, shortness of breath, etc. between now and your scheduled surgery, please notify us at the above number.   Remember:  Do not eat or drink after midnight.        DO NOT miss any doses of your eliquis before your procedure.     If you take insulin in the evening, only take 1/2 of your prescribed dose the night before your procedure.     DO NOT take any medications for diabetes the morning of your procedure.     Take these medicines the morning of surgery with A SIP OF WATER       amlodipine, cymbalta, allegra, gabapentin, ativan (if needed).     Do not wear jewelry, make-up or nail polish.  Do not wear lotions, powders, or perfumes, or deodorant.  Do not shave 48 hours prior to surgery.  Men may shave face and neck.  Do not bring valuables to the hospital.  [facility] is not responsible for any belongings or valuables.  Contacts, dentures or bridgework may not be worn into surgery.  Leave your suitcase in the car.  After surgery it may be brought to your room.  For patients admitted to the hospital, discharge time will be determined by your treatment team.  Patients discharged the day of surgery will not be allowed to drive home and must have someone with them for 24 hours.    Special instructions:   DO NOT smoke tobacco or vape for 24 hours before your procedure.   Please read over the following fact sheets that you were given. Anesthesia Post-op Instructions and Care and Recovery After Surgery      Electrical Cardioversion Electrical cardioversion is the delivery of a jolt of electricity to restore a normal rhythm to the heart. A rhythm that is too fast or is not  regular keeps the heart from pumping well. In this procedure, sticky patches or metal paddles are placed on the chest to deliver electricity to the heart from a device. This procedure may be done in an emergency if: There is low or no blood pressure as a result of the heart rhythm. Normal rhythm must be restored as fast as possible to protect the brain and heart from further damage. It may save a life. This may also be a scheduled procedure for irregular or fast heart rhythms that are not immediately life-threatening. Tell a health care provider about: Any allergies you have. All medicines you are taking, including vitamins, herbs, eye drops, creams, and over-the-counter medicines. Any problems you or family members have had with anesthetic medicines. Any blood disorders you have. Any surgeries you have had. Any medical conditions you have. Whether you are pregnant or may be pregnant. What are the risks? Generally, this is a safe procedure. However, problems may occur, including: Allergic reactions to medicines. A blood clot that breaks free and travels to other parts of your body. The possible return of an abnormal heart rhythm within hours or days after the procedure. Your heart stopping (cardiac arrest). This is rare. What happens before the procedure? Medicines Your health care provider may have you start  taking: Blood-thinning medicines (anticoagulants) so your blood does not clot as easily. Medicines to help stabilize your heart rate and rhythm. Ask your health care provider about: Changing or stopping your regular medicines. This is especially important if you are taking diabetes medicines or blood thinners. Taking medicines such as aspirin and ibuprofen. These medicines can thin your blood. Do not take these medicines unless your health care provider tells you to take them. Taking over-the-counter medicines, vitamins, herbs, and supplements. General instructions Follow  instructions from your health care provider about eating or drinking restrictions. Plan to have someone take you home from the hospital or clinic. If you will be going home right after the procedure, plan to have someone with you for 24 hours. Ask your health care provider what steps will be taken to help prevent infection. These may include washing your skin with a germ-killing soap. What happens during the procedure?  An IV will be inserted into one of your veins. Sticky patches (electrodes) or metal paddles may be placed on your chest. You will be given a medicine to help you relax (sedative). An electrical shock will be delivered. The procedure may vary among health care providers and hospitals. What can I expect after the procedure? Your blood pressure, heart rate, breathing rate, and blood oxygen level will be monitored until you leave the hospital or clinic. Your heart rhythm will be watched to make sure it does not change. You may have some redness on the skin where the shocks were given. Follow these instructions at home: Do not drive for 24 hours if you were given a sedative during your procedure. Take over-the-counter and prescription medicines only as told by your health care provider. Ask your health care provider how to check your pulse. Check it often. Rest for 48 hours after the procedure or as told by your health care provider. Avoid or limit your caffeine use as told by your health care provider. Keep all follow-up visits as told by your health care provider. This is important. Contact a health care provider if: You feel like your heart is beating too quickly or your pulse is not regular. You have a serious muscle cramp that does not go away. Get help right away if: You have discomfort in your chest. You are dizzy or you feel faint. You have trouble breathing or you are short of breath. Your speech is slurred. You have trouble moving an arm or leg on one side of your  body. Your fingers or toes turn cold or blue. Summary Electrical cardioversion is the delivery of a jolt of electricity to restore a normal rhythm to the heart. This procedure may be done right away in an emergency or may be a scheduled procedure if the condition is not an emergency. Generally, this is a safe procedure. After the procedure, check your pulse often as told by your health care provider. This information is not intended to replace advice given to you by your health care provider. Make sure you discuss any questions you have with your health care provider. Document Revised: 06/17/2021 Document Reviewed: 02/18/2019 Elsevier Patient Education  [place] After This sheet gives you information about how to care for yourself after your procedure. Your health care provider may also give you more specific instructions. If you have problems or questions, contact your health care provider. What can I expect after the procedure? After the procedure, it is common to have: Tiredness. Forgetfulness about what happened  after the procedure. Impaired judgment for important decisions. Nausea or vomiting. Some difficulty with balance. Follow these instructions at home: For the time period you were told by your health care provider:     Rest as needed. Do not participate in activities where you could fall or become injured. Do not drive or use machinery. Do not drink alcohol. Do not take sleeping pills or medicines that cause drowsiness. Do not make important decisions or sign legal documents. Do not take care of children on your own. Eating and drinking Follow the diet that is recommended by your health care provider. Drink enough fluid to keep your urine pale yellow. If you vomit: Drink water, juice, or soup when you can drink without vomiting. Make sure you have little or no nausea before eating solid foods. General instructions Have a  responsible adult stay with you for the time you are told. It is important to have someone help care for you until you are awake and alert. Take over-the-counter and prescription medicines only as told by your health care provider. If you have sleep apnea, surgery and certain medicines can increase your risk for breathing problems. Follow instructions from your health care provider about wearing your sleep device: Anytime you are sleeping, including during daytime naps. While taking prescription pain medicines, sleeping medicines, or medicines that make you drowsy. Avoid smoking. Keep all follow-up visits as told by your health care provider. This is important. Contact a health care provider if: You keep feeling nauseous or you keep vomiting. You feel light-headed. You are still sleepy or having trouble with balance after 24 hours. You develop a rash. You have a fever. You have redness or swelling around the IV site. Get help right away if: You have trouble breathing. You have new-onset confusion at home. Summary For several hours after your procedure, you may feel tired. You may also be forgetful and have poor judgment. Have a responsible adult stay with you for the time you are told. It is important to have someone help care for you until you are awake and alert. Rest as told. Do not drive or operate machinery. Do not drink alcohol or take sleeping pills. Get help right away if you have trouble breathing, or if you suddenly become confused. This information is not intended to replace advice given to you by your health care provider. Make sure you discuss any questions you have with your health care provider. Document Revised: 06/22/2021 Document Reviewed: 06/20/2019 Elsevier Patient Education  [place].

## 2022-05-16 NOTE — Patient Instructions (Signed)
Medication Instructions:  Your physician recommends that you continue on your current medications as directed. Please refer to the Current Medication list given to you today.  *If you need a refill on your cardiac medications before your next appointment, please call your pharmacy*   Lab Work: Your physician recommends that you return for lab work at [facility]   If you have labs (blood work) drawn today and your tests are completely normal, you will receive your results only by: MyChart Message (if you have MyChart) OR A paper copy in the mail If you have any lab test that is abnormal or we need to change your treatment, we will call you to review the results.   Testing/Procedures: Your physician has recommended that you have a Cardioversion (DCCV). Electrical Cardioversion uses a jolt of electricity to your heart either through paddles or wired patches attached to your chest. This is a controlled, usually prescheduled, procedure. Defibrillation is done under light anesthesia in the hospital, and you usually go home the day of the procedure. This is done to get your heart back into a normal rhythm. You are not awake for the procedure. Please see the instruction sheet given to you today.  Your physician has requested that you have an echocardiogram. Echocardiography is a painless test that uses sound waves to create images of your heart. It provides your doctor with information about the size and shape of your heart and how well your heart's chambers and valves are working. This procedure takes approximately one hour. There are no restrictions for this procedure. Please do NOT wear cologne, perfume, aftershave, or lotions (deodorant is allowed). Please arrive 15 minutes prior to your appointment time.    Follow-Up: At [facility], you and your health needs are our priority.  As part of our continuing mission to provide you with exceptional heart care, we have created designated  Provider Care Teams.  These Care Teams include your primary Cardiologist (physician) and Advanced Practice Providers (APPs -  Physician Assistants and Nurse Practitioners) who all work together to provide you with the care you need, when you need it.  We recommend signing up for the patient portal called "MyChart".  Sign up information is provided on this After Visit Summary.  MyChart is used to connect with patients for Virtual Visits (Telemedicine).  Patients are able to view lab/test results, encounter notes, upcoming appointments, etc.  Non-urgent messages can be sent to your provider as well.   To learn more about what you can do with MyChart, go to [email or web address].    Your next appointment:   2 week(s) after Cardioversion   The format for your next appointment:   In Person  Provider:   You may see Carlyle Dolly, MD or one of the following Advanced Practice Providers on your designated Care Team:   Bernerd Pho, PA-C  Ermalinda Barrios, [place]     Other Instructions Thank you for choosing [facility]!    Important Information About Sugar

## 2022-05-16 NOTE — Progress Notes (Signed)
Cardiology Office Note:    Date:  05/16/2022   ID:  Andrea Grant, DOB 13-Jan-1961, MRN [id number]  PCP:  Glenda Chroman, MD  [facility] Cardiologist:  Carlyle Dolly, MD  [facility] Electrophysiologist:  None   Referring MD: Glenda Chroman, MD   Chief Complaint: 6 month follow-up  History of Present Illness:    Andrea Grant is a 61 y.o. female with a hx of CAD, HTN, OSA, HLD, DM2, Afib who presents for 6 month follow-up.  Patient was admitted in 07/2014 with chest pain and mild troponin elevation in the setting of severe HTN. Cath showed nonobstructive CAD. Echo showed normal LVEF.   She was diagnosed with Afib in 12/2020 and started on Eliquis and metoprolol. She saw the Afib clinic and lasix was started. CHADSVASC at least 3. Plan was to undergo cardioversion, but this was held due to ophthalmology procedure. She was lost to follow-up.   Today, EKG shows afib hr 87bpm. She is working on the weekends. BP is high, but she had not had meds. IT is 130/70s at home. She denies chest pain, SOB, lower leg edema. No orthopnea or pnd. No palpitations or dizziness. She could not afford the sleep apnea mouthpiece, so she is trying to lose weight. She has a CPAP, but cannot use it.   Past Medical History:  Diagnosis Date   Anxiety disorder    CAD (coronary artery disease)    Cath 07/2014 with nonobstructive CAD mild to moderate, echo 07/2014 LVEF 60 to 65%.   Carpal tunnel syndrome, right    Cyst of skin    Depression    Hypercholesteremia    Hypertensive heart disease 07/27/2014   Insomnia    Lumbar radiculopathy    OSA (obstructive sleep apnea)    using CPAP sometimes   Persistent atrial fibrillation ([place])    Retinal detachment    Type 2 diabetes mellitus with peripheral neuropathy ([place]) 07/27/2014    Past Surgical History:  Procedure Laterality Date   COLONOSCOPY WITH PROPOFOL N/A 03/31/2020   Procedure: COLONOSCOPY WITH PROPOFOL;  Surgeon: Daneil Dolin, MD;   [facility];  Service: Endoscopy;  Laterality: N/A;  10:00am, pt can't come earlier, transportation   [place] N/A 07/29/2014   Procedure: LEFT HEART CATHETERIZATION WITH CORONARY ANGIOGRAM;  Surgeon: Sinclair Grooms, MD;  Location: [facility] CATH LAB;  Service: Cardiovascular;  Laterality: N/A;   POLYPECTOMY  03/31/2020   Procedure: POLYPECTOMY;  Surgeon: Daneil Dolin, MD;  [facility];  Service: Endoscopy;;    Current Medications: Current Meds  Medication Sig   amLODipine (NORVASC) 10 MG tablet Take 1 tablet (10 mg total) by mouth daily.   apixaban (ELIQUIS) 5 MG TABS tablet Take 1 tablet by mouth twice daily   atorvastatin (LIPITOR) 10 MG tablet Take 10 mg by mouth daily.   Carboxymethylcellulose Sodium (ARTIFICIAL TEARS OP) Place 1 drop into both eyes daily in the afternoon.   DULoxetine (CYMBALTA) 60 MG capsule Take 60 mg by mouth daily.   fexofenadine (ALLEGRA) 180 MG tablet Take 180 mg by mouth daily as needed for allergies or rhinitis.   furosemide (LASIX) 20 MG tablet Take one tablet by mouth for 3 days and if gains 3-5 pounds take one tablet as needed   gabapentin (NEURONTIN) 300 MG capsule Take 300 mg by mouth 3 (three) times daily.   glimepiride (AMARYL) 4 MG tablet Take 4 mg by mouth in  the morning and at bedtime.    HUMALOG MIX 75/25 KWIKPEN (75-25) 100 UNIT/ML KwikPen Inject into the skin.   hydrALAZINE (APRESOLINE) 50 MG tablet Take 50 mg by mouth 2 (two) times daily.   JARDIANCE 10 MG TABS tablet Take 10 mg by mouth daily.   LORazepam (ATIVAN) 0.5 MG tablet Take 0.5 mg by mouth daily as needed.   metFORMIN (GLUCOPHAGE) 1000 MG tablet Take 1,000 mg by mouth 2 (two) times daily with a meal.   metoprolol tartrate (LOPRESSOR) 25 MG tablet Take 1 tablet (25 mg total) by mouth 2 (two) times daily.   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  Tobacco Use   Smoking status: Never   Smokeless tobacco: Never  Vaping Use   Vaping Use: Never used  Substance and Sexual Activity   Alcohol use: No    Alcohol/week: 0.0 standard drinks of alcohol   Drug use: No   Sexual activity: Never    Birth control/protection: Abstinence  Other Topics Concern   Not on file  Social History Narrative   Lives with 2 sons   Social Determinants of Health   Financial Resource Strain: Not on file  Food Insecurity: Not on file  Transportation Needs: Not on file  Physical Activity: Not on file  Stress: Not on file  Social Connections: Not on file     Family History: The patient's family history includes CVA in her father; Cancer in her mother; Heart disease in her mother. There is no history of Colon cancer.  ROS:   Please see the history of present illness.     All other systems reviewed and are negative.  EKGs/Labs/Other Studies Reviewed:    The following studies were reviewed today:  Echo 2015 Study Conclusions   - Left ventricle: The cavity size was normal. Wall thickness was    increased in a pattern of moderate LVH. Systolic function was    normal. The estimated ejection fraction was in the range of 60%    to 65%. Wall motion was normal; there were no regional wall    motion abnormalities.  - Left atrium: The atrium was mildly dilated.  - Pericardium, extracardiac: A trivial pericardial effusion was    identified.   EKG:  EKG is ordered today.  The ekg ordered today demonstrates Afib 87bpm, nonspecific T wave changes  Recent Labs: No results found for requested labs within last 365 days.  Recent Lipid Panel    Component  Value Date/Time   CHOL 258 (H) 07/28/2014 0045   TRIG 185 (H) 07/28/2014 0045   HDL 36 (L) 07/28/2014 0045   CHOLHDL 7.2 07/28/2014 0045   VLDL 37 07/28/2014 0045   LDLCALC 185 (H) 07/28/2014 0045    Physical Exam:    VS:  BP (!) 170/100 Comment: has not taken meds today  Pulse 87   Ht '5\' 4"'$  (1.626 m)   Wt 219 lb (99.3 kg)   LMP  (LMP Unknown)   SpO2 99%   BMI 37.59 kg/m     Wt Readings from Last  3 Encounters:  05/16/22 219 lb (99.3 kg)  07/23/21 222 lb 1.9 oz (100.8 kg)  02/09/21 217 lb 12.8 oz (98.8 kg)     GEN:  Well nourished, well developed in no acute distress HEENT: Normal NECK: No JVD; No carotid bruits LYMPHATICS: No lymphadenopathy CARDIAC: Irreg Irreg, no murmurs, rubs, gallops RESPIRATORY:  Clear to auscultation without rales, wheezing or rhonchi  ABDOMEN: Soft, non-tender, non-distended MUSCULOSKELETAL:  No edema; No deformity  SKIN: Warm and dry NEUROLOGIC:  Alert and oriented x 3 PSYCHIATRIC:  Normal affect   ASSESSMENT:    1. Longstanding persistent atrial fibrillation ([place])   2. Coronary artery disease involving native coronary artery of native heart without angina pectoris   3. Essential hypertension   4. Hyperlipidemia, mixed   5. OSA (obstructive sleep apnea)    PLAN:    In order of problems listed above:  Persistent Afib Patient was diagnosed with Afib 12/2020 with plan to pursue cardioversion. She was started on Eliquis and metoprolol, but was lost to follow-up. EKG today shows rate controlled Afib. CHADSVASC of 3. She is on Eliquis '5mg'$  BID and denies missing doses. We will set her up for cardioversion to give patient a chance to convert to NSR. She also takes lasix and potassium as needed for volume management. I will also update an echocardiogram. We will see her back after the cardioversion.  CAD Nonobstructive CAD by LHC in 2015. Patient denies anginal symptoms. No ASA with Eliquis. Continue BB and Lipitor. NO further ischemic work-up  indicated at this time.   HTN BP is high today, but she has not had her medications. She says BP normally 130/70s. Continue Hydralazine '50mg'$  BID and metoprolol tartrate '25mg'$  BID.  HLD She has not had recent labs, can order at follow-up.   OSA She has a CPAP machine, but does not tolerate it. She is planning on losing weight to improve sleep apnea.  Disposition: Follow up in 3 week(s) with MD/APP   Shared Decision Making/Informed Consent   Shared Decision Making/Informed Consent The risks (stroke, cardiac arrhythmias rarely resulting in the need for a temporary or permanent pacemaker, skin irritation or burns and complications associated with conscious sedation including aspiration, arrhythmia, respiratory failure and death), benefits (restoration of normal sinus rhythm) and alternatives of a direct current cardioversion were explained in detail to Ms. Gunnels and she agrees to proceed.      Signed, Maja Mccaffery Ninfa Meeker, PA-C  05/16/2022 3:10 PM    [facility]

## 2022-05-16 NOTE — H&P (View-Only) (Signed)
Cardiology Office Note:    Date:  05/16/2022   ID:  Andrea Grant, DOB 02-12-1961, MRN [id number]  PCP:  Glenda Chroman, MD  [facility] Cardiologist:  Carlyle Dolly, MD  [facility] Electrophysiologist:  None   Referring MD: Glenda Chroman, MD   Chief Complaint: 6 month follow-up  History of Present Illness:    Andrea Grant is a 61 y.o. female with a hx of CAD, HTN, OSA, HLD, DM2, Afib who presents for 6 month follow-up.  Patient was admitted in 07/2014 with chest pain and mild troponin elevation in the setting of severe HTN. Cath showed nonobstructive CAD. Echo showed normal LVEF.   She was diagnosed with Afib in 12/2020 and started on Eliquis and metoprolol. She saw the Afib clinic and lasix was started. CHADSVASC at least 3. Plan was to undergo cardioversion, but this was held due to ophthalmology procedure. She was lost to follow-up.   Today, EKG shows afib hr 87bpm. She is working on the weekends. BP is high, but she had not had meds. IT is 130/70s at home. She denies chest pain, SOB, lower leg edema. No orthopnea or pnd. No palpitations or dizziness. She could not afford the sleep apnea mouthpiece, so she is trying to lose weight. She has a CPAP, but cannot use it.   Past Medical History:  Diagnosis Date   Anxiety disorder    CAD (coronary artery disease)    Cath 07/2014 with nonobstructive CAD mild to moderate, echo 07/2014 LVEF 60 to 65%.   Carpal tunnel syndrome, right    Cyst of skin    Depression    Hypercholesteremia    Hypertensive heart disease 07/27/2014   Insomnia    Lumbar radiculopathy    OSA (obstructive sleep apnea)    using CPAP sometimes   Persistent atrial fibrillation ([place])    Retinal detachment    Type 2 diabetes mellitus with peripheral neuropathy ([place]) 07/27/2014    Past Surgical History:  Procedure Laterality Date   COLONOSCOPY WITH PROPOFOL N/A 03/31/2020   Procedure: COLONOSCOPY WITH PROPOFOL;  Surgeon: Daneil Dolin, MD;   [facility];  Service: Endoscopy;  Laterality: N/A;  10:00am, pt can't come earlier, transportation   [place] N/A 07/29/2014   Procedure: LEFT HEART CATHETERIZATION WITH CORONARY ANGIOGRAM;  Surgeon: Sinclair Grooms, MD;  Location: [facility] CATH LAB;  Service: Cardiovascular;  Laterality: N/A;   POLYPECTOMY  03/31/2020   Procedure: POLYPECTOMY;  Surgeon: Daneil Dolin, MD;  [facility];  Service: Endoscopy;;    Current Medications: Current Meds  Medication Sig   amLODipine (NORVASC) 10 MG tablet Take 1 tablet (10 mg total) by mouth daily.   apixaban (ELIQUIS) 5 MG TABS tablet Take 1 tablet by mouth twice daily   atorvastatin (LIPITOR) 10 MG tablet Take 10 mg by mouth daily.   Carboxymethylcellulose Sodium (ARTIFICIAL TEARS OP) Place 1 drop into both eyes daily in the afternoon.   DULoxetine (CYMBALTA) 60 MG capsule Take 60 mg by mouth daily.   fexofenadine (ALLEGRA) 180 MG tablet Take 180 mg by mouth daily as needed for allergies or rhinitis.   furosemide (LASIX) 20 MG tablet Take one tablet by mouth for 3 days and if gains 3-5 pounds take one tablet as needed   gabapentin (NEURONTIN) 300 MG capsule Take 300 mg by mouth 3 (three) times daily.   glimepiride (AMARYL) 4 MG tablet Take 4 mg by mouth in  the morning and at bedtime.    HUMALOG MIX 75/25 KWIKPEN (75-25) 100 UNIT/ML KwikPen Inject into the skin.   hydrALAZINE (APRESOLINE) 50 MG tablet Take 50 mg by mouth 2 (two) times daily.   JARDIANCE 10 MG TABS tablet Take 10 mg by mouth daily.   LORazepam (ATIVAN) 0.5 MG tablet Take 0.5 mg by mouth daily as needed.   metFORMIN (GLUCOPHAGE) 1000 MG tablet Take 1,000 mg by mouth 2 (two) times daily with a meal.   metoprolol tartrate (LOPRESSOR) 25 MG tablet Take 1 tablet (25 mg total) by mouth 2 (two) times daily.   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  Tobacco Use   Smoking status: Never   Smokeless tobacco: Never  Vaping Use   Vaping Use: Never used  Substance and Sexual Activity   Alcohol use: No    Alcohol/week: 0.0 standard drinks of alcohol   Drug use: No   Sexual activity: Never    Birth control/protection: Abstinence  Other Topics Concern   Not on file  Social History Narrative   Lives with 2 sons   Social Determinants of Health   Financial Resource Strain: Not on file  Food Insecurity: Not on file  Transportation Needs: Not on file  Physical Activity: Not on file  Stress: Not on file  Social Connections: Not on file     Family History: The patient's family history includes CVA in her father; Cancer in her mother; Heart disease in her mother. There is no history of Colon cancer.  ROS:   Please see the history of present illness.     All other systems reviewed and are negative.  EKGs/Labs/Other Studies Reviewed:    The following studies were reviewed today:  Echo 2015 Study Conclusions   - Left ventricle: The cavity size was normal. Wall thickness was    increased in a pattern of moderate LVH. Systolic function was    normal. The estimated ejection fraction was in the range of 60%    to 65%. Wall motion was normal; there were no regional wall    motion abnormalities.  - Left atrium: The atrium was mildly dilated.  - Pericardium, extracardiac: A trivial pericardial effusion was    identified.   EKG:  EKG is ordered today.  The ekg ordered today demonstrates Afib 87bpm, nonspecific T wave changes  Recent Labs: No results found for requested labs within last 365 days.  Recent Lipid Panel    Component  Value Date/Time   CHOL 258 (H) 07/28/2014 0045   TRIG 185 (H) 07/28/2014 0045   HDL 36 (L) 07/28/2014 0045   CHOLHDL 7.2 07/28/2014 0045   VLDL 37 07/28/2014 0045   LDLCALC 185 (H) 07/28/2014 0045    Physical Exam:    VS:  BP (!) 170/100 Comment: has not taken meds today  Pulse 87   Ht '5\' 4"'$  (1.626 m)   Wt 219 lb (99.3 kg)   LMP  (LMP Unknown)   SpO2 99%   BMI 37.59 kg/m     Wt Readings from Last  3 Encounters:  05/16/22 219 lb (99.3 kg)  07/23/21 222 lb 1.9 oz (100.8 kg)  02/09/21 217 lb 12.8 oz (98.8 kg)     GEN:  Well nourished, well developed in no acute distress HEENT: Normal NECK: No JVD; No carotid bruits LYMPHATICS: No lymphadenopathy CARDIAC: Irreg Irreg, no murmurs, rubs, gallops RESPIRATORY:  Clear to auscultation without rales, wheezing or rhonchi  ABDOMEN: Soft, non-tender, non-distended MUSCULOSKELETAL:  No edema; No deformity  SKIN: Warm and dry NEUROLOGIC:  Alert and oriented x 3 PSYCHIATRIC:  Normal affect   ASSESSMENT:    1. Longstanding persistent atrial fibrillation ([place])   2. Coronary artery disease involving native coronary artery of native heart without angina pectoris   3. Essential hypertension   4. Hyperlipidemia, mixed   5. OSA (obstructive sleep apnea)    PLAN:    In order of problems listed above:  Persistent Afib Patient was diagnosed with Afib 12/2020 with plan to pursue cardioversion. She was started on Eliquis and metoprolol, but was lost to follow-up. EKG today shows rate controlled Afib. CHADSVASC of 3. She is on Eliquis '5mg'$  BID and denies missing doses. We will set her up for cardioversion to give patient a chance to convert to NSR. She also takes lasix and potassium as needed for volume management. I will also update an echocardiogram. We will see her back after the cardioversion.  CAD Nonobstructive CAD by LHC in 2015. Patient denies anginal symptoms. No ASA with Eliquis. Continue BB and Lipitor. NO further ischemic work-up  indicated at this time.   HTN BP is high today, but she has not had her medications. She says BP normally 130/70s. Continue Hydralazine '50mg'$  BID and metoprolol tartrate '25mg'$  BID.  HLD She has not had recent labs, can order at follow-up.   OSA She has a CPAP machine, but does not tolerate it. She is planning on losing weight to improve sleep apnea.  Disposition: Follow up in 3 week(s) with MD/APP   Shared Decision Making/Informed Consent   Shared Decision Making/Informed Consent The risks (stroke, cardiac arrhythmias rarely resulting in the need for a temporary or permanent pacemaker, skin irritation or burns and complications associated with conscious sedation including aspiration, arrhythmia, respiratory failure and death), benefits (restoration of normal sinus rhythm) and alternatives of a direct current cardioversion were explained in detail to Ms. Gathright and she agrees to proceed.      Signed, Demetre Monaco Ninfa Meeker, PA-C  05/16/2022 3:10 PM    [facility]

## 2022-05-17 DIAGNOSIS — S134XXA Sprain of ligaments of cervical spine, initial encounter: Secondary | ICD-10-CM | POA: Diagnosis not present

## 2022-05-18 ENCOUNTER — Encounter (HOSPITAL_COMMUNITY): Payer: Self-pay

## 2022-05-18 ENCOUNTER — Telehealth: Payer: Self-pay | Admitting: Cardiology

## 2022-05-18 ENCOUNTER — Encounter (HOSPITAL_COMMUNITY)
Admission: RE | Admit: 2022-05-18 | Discharge: 2022-05-18 | Disposition: A | Discharge status: Home / Self Care | Payer: Medicare Other | Source: Ambulatory Visit | Attending: Internal Medicine | Admitting: Internal Medicine

## 2022-05-18 DIAGNOSIS — S134XXA Sprain of ligaments of cervical spine, initial encounter: Secondary | ICD-10-CM | POA: Diagnosis not present

## 2022-05-18 DIAGNOSIS — E1142 Type 2 diabetes mellitus with diabetic polyneuropathy: Secondary | ICD-10-CM

## 2022-05-18 DIAGNOSIS — I4811 Longstanding persistent atrial fibrillation: Secondary | ICD-10-CM

## 2022-05-18 NOTE — Telephone Encounter (Signed)
Returned call to pt. No answer. Left msg to call back.  

## 2022-05-18 NOTE — Telephone Encounter (Signed)
Patient is calling stating someone called her yesterday wanting to go over her medications for her upcoming procedure 10/20. Was unable to find who called. Please advise.

## 2022-05-19 ENCOUNTER — Other Ambulatory Visit (HOSPITAL_COMMUNITY)
Admission: RE | Admit: 2022-05-19 | Discharge: 2022-05-19 | Disposition: A | Discharge status: Home / Self Care | Payer: Medicare Other | Source: Ambulatory Visit | Attending: Medical | Admitting: Medical

## 2022-05-19 DIAGNOSIS — I4811 Longstanding persistent atrial fibrillation: Secondary | ICD-10-CM | POA: Insufficient documentation

## 2022-05-19 LAB — CBC
HCT: 40.6 % (ref 36.0–46.0)
Hemoglobin: 12.7 g/dL (ref 12.0–15.0)
MCH: 28.7 pg (ref 26.0–34.0)
MCHC: 31.3 g/dL (ref 30.0–36.0)
MCV: 91.9 fL (ref 80.0–100.0)
Platelets: 271 10*3/uL (ref 150–400)
RBC: 4.42 MIL/uL (ref 3.87–5.11)
RDW: 16.5 % — ABNORMAL HIGH (ref 11.5–15.5)
WBC: 5.4 10*3/uL (ref 4.0–10.5)
nRBC: 0 % (ref 0.0–0.2)

## 2022-05-19 LAB — BASIC METABOLIC PANEL
Anion gap: 8 (ref 5–15)
BUN: 15 mg/dL (ref 6–20)
CO2: 24 mmol/L (ref 22–32)
Calcium: 9.2 mg/dL (ref 8.9–10.3)
Chloride: 106 mmol/L (ref 98–111)
Creatinine, Ser: 1.05 mg/dL — ABNORMAL HIGH (ref 0.44–1.00)
GFR, Estimated: >60 mL/min (ref >60)
Glucose, Bld: 98 mg/dL (ref 70–99)
Potassium: 4.1 mmol/L (ref 3.5–5.1)
Sodium: 138 mmol/L (ref 135–145)

## 2022-05-19 LAB — PROTIME-INR
INR: 0.9 (ref 0.8–1.2)
Prothrombin Time: 12.5 seconds (ref 11.4–15.2)

## 2022-05-20 ENCOUNTER — Ambulatory Visit (HOSPITAL_COMMUNITY)
Admission: RE | Admit: 2022-05-20 | Discharge: 2022-05-20 | Disposition: A | Discharge status: Home / Self Care | Payer: Medicare Other | Attending: Internal Medicine | Admitting: Internal Medicine

## 2022-05-20 ENCOUNTER — Encounter (HOSPITAL_COMMUNITY): Payer: Self-pay | Admitting: Internal Medicine

## 2022-05-20 ENCOUNTER — Ambulatory Visit (HOSPITAL_COMMUNITY): Payer: Medicare Other | Admitting: Anesthesiology

## 2022-05-20 ENCOUNTER — Ambulatory Visit (HOSPITAL_BASED_OUTPATIENT_CLINIC_OR_DEPARTMENT_OTHER): Payer: Medicare Other | Admitting: Anesthesiology

## 2022-05-20 ENCOUNTER — Telehealth: Payer: Self-pay | Admitting: Cardiology

## 2022-05-20 ENCOUNTER — Encounter (HOSPITAL_COMMUNITY)
Admission: RE | Disposition: A | Discharge status: Home / Self Care | Payer: Self-pay | Source: Home / Self Care | Attending: Internal Medicine

## 2022-05-20 DIAGNOSIS — I4891 Unspecified atrial fibrillation: Secondary | ICD-10-CM

## 2022-05-20 DIAGNOSIS — I119 Hypertensive heart disease without heart failure: Secondary | ICD-10-CM | POA: Diagnosis not present

## 2022-05-20 DIAGNOSIS — Z79899 Other long term (current) drug therapy: Secondary | ICD-10-CM | POA: Insufficient documentation

## 2022-05-20 DIAGNOSIS — E119 Type 2 diabetes mellitus without complications: Secondary | ICD-10-CM

## 2022-05-20 DIAGNOSIS — I2510 Atherosclerotic heart disease of native coronary artery without angina pectoris: Secondary | ICD-10-CM | POA: Insufficient documentation

## 2022-05-20 DIAGNOSIS — E785 Hyperlipidemia, unspecified: Secondary | ICD-10-CM | POA: Diagnosis not present

## 2022-05-20 DIAGNOSIS — I10 Essential (primary) hypertension: Secondary | ICD-10-CM | POA: Diagnosis not present

## 2022-05-20 DIAGNOSIS — I4811 Longstanding persistent atrial fibrillation: Secondary | ICD-10-CM | POA: Diagnosis not present

## 2022-05-20 DIAGNOSIS — Z7984 Long term (current) use of oral hypoglycemic drugs: Secondary | ICD-10-CM

## 2022-05-20 DIAGNOSIS — G4733 Obstructive sleep apnea (adult) (pediatric): Secondary | ICD-10-CM | POA: Diagnosis not present

## 2022-05-20 DIAGNOSIS — Z7901 Long term (current) use of anticoagulants: Secondary | ICD-10-CM | POA: Diagnosis not present

## 2022-05-20 DIAGNOSIS — E1142 Type 2 diabetes mellitus with diabetic polyneuropathy: Secondary | ICD-10-CM

## 2022-05-20 HISTORY — PX: CARDIOVERSION: SHX1299

## 2022-05-20 LAB — GLUCOSE, CAPILLARY: Glucose-Capillary: 119 mg/dL — ABNORMAL HIGH (ref 70–99)

## 2022-05-20 SURGERY — CARDIOVERSION
Anesthesia: General

## 2022-05-20 MED ORDER — SODIUM CHLORIDE 0.9 % IV SOLN: INTRAVENOUS | Status: DC

## 2022-05-20 MED ORDER — LIDOCAINE 2% (20 MG/ML) 5 ML SYRINGE
INTRAMUSCULAR | Status: DC | PRN
  Administered 2022-05-20: 50 mg via INTRAVENOUS

## 2022-05-20 MED ORDER — AMIODARONE HCL 200 MG PO TABS: 200.0000 mg | ORAL_TABLET | Freq: Two times a day (BID) | ORAL | 0 refills | Status: DC

## 2022-05-20 MED ORDER — LACTATED RINGERS IV SOLN: INTRAVENOUS | Status: DC | PRN

## 2022-05-20 MED ORDER — ORAL CARE MOUTH RINSE: 15.0000 mL | Freq: Once | OROMUCOSAL | Status: DC

## 2022-05-20 MED ORDER — AMIODARONE HCL 200 MG PO TABS: 200.0000 mg | ORAL_TABLET | Freq: Every day | ORAL | 2 refills | Status: DC

## 2022-05-20 MED ORDER — CHLORHEXIDINE GLUCONATE 0.12 % MT SOLN: 15.0000 mL | Freq: Once | OROMUCOSAL | Status: DC

## 2022-05-20 MED ORDER — LACTATED RINGERS IV SOLN: INTRAVENOUS | Status: DC

## 2022-05-20 MED ORDER — PROPOFOL 10 MG/ML IV BOLUS
INTRAVENOUS | Status: DC | PRN
  Administered 2022-05-20: 80 mg via INTRAVENOUS

## 2022-05-20 NOTE — Transfer of Care (Signed)
Immediate Anesthesia Transfer of Care Note  Patient: Andrea Grant  Procedure(s) Performed: CARDIOVERSION  Patient Location: PACU  Anesthesia Type:General  Level of Consciousness: sedated, patient cooperative and responds to stimulation  Airway & Oxygen Therapy: Patient Spontanous Breathing and Patient connected to nasal cannula oxygen  Post-op Assessment: Report given to RN, Post -op Vital signs reviewed and stable and Patient moving all extremities  Post vital signs: Reviewed and stable  Last Vitals:  Vitals Value Taken Time  BP    Temp    Pulse    Resp    SpO2      Last Pain:  Vitals:   05/20/22 1155  TempSrc: Oral  PainSc: 0-No pain         Complications: No notable events documented.

## 2022-05-20 NOTE — Interval H&P Note (Signed)
History and Physical Interval Note:  05/20/2022 12:18 PM  Andrea Grant  has presented today for surgery, with the diagnosis of a-fib.  The various methods of treatment have been discussed with the patient and family. After consideration of risks, benefits and other options for treatment, the patient has consented to  Procedure(s): CARDIOVERSION (N/A) as a surgical intervention.  The patient's history has been reviewed, patient examined, no change in status, stable for surgery.  I have reviewed the patient's chart and labs.  Questions were answered to the patient's satisfaction.     [name] P Giabella Duhart

## 2022-05-20 NOTE — Telephone Encounter (Signed)
Vishnu P Mallipeddi, MD  05/19/2022  8:18 PM EDT     Normal labs.

## 2022-05-20 NOTE — Anesthesia Preprocedure Evaluation (Signed)
Anesthesia Evaluation  Patient identified by MRN, date of birth, ID band Patient awake    Reviewed: Allergy & Precautions, H&P , NPO status , Patient's Chart, lab work & pertinent test results, reviewed documented beta blocker date and time   Airway Mallampati: II  TM Distance: >3 FB Neck ROM: full    Dental no notable dental hx.    Pulmonary neg pulmonary ROS,    Pulmonary exam normal breath sounds clear to auscultation       Cardiovascular Exercise Tolerance: Good hypertension, + CAD  + dysrhythmias Atrial Fibrillation  Rhythm:irregular Rate:Normal     Neuro/Psych PSYCHIATRIC DISORDERS Anxiety Depression  Neuromuscular disease    GI/Hepatic negative GI ROS, Neg liver ROS,   Endo/Other  negative endocrine ROSdiabetes, Type 2  Renal/GU negative Renal ROS  negative genitourinary   Musculoskeletal   Abdominal   Peds  Hematology negative hematology ROS (+)   Anesthesia Other Findings   Reproductive/Obstetrics negative OB ROS                             Anesthesia Physical Anesthesia Plan  ASA: 3  Anesthesia Plan: General   Post-op Pain Management:    Induction:   PONV Risk Score and Plan: Propofol infusion  Airway Management Planned:   Additional Equipment:   Intra-op Plan:   Post-operative Plan:   Informed Consent: I have reviewed the patients History and Physical, chart, labs and discussed the procedure including the risks, benefits and alternatives for the proposed anesthesia with the patient or authorized representative who has indicated his/her understanding and acceptance.     Dental Advisory Given  Plan Discussed with: CRNA  Anesthesia Plan Comments:         Anesthesia Quick Evaluation

## 2022-05-20 NOTE — Telephone Encounter (Signed)
Patient notified and verbalized understanding. Patient had no questions or concerns at this time. PCP copied 

## 2022-05-20 NOTE — Telephone Encounter (Signed)
Patient stated she overslept

## 2022-05-20 NOTE — Progress Notes (Signed)
Electrical Cardioversion Procedure Note Andrea Grant [id number] 08-12-1960  Procedure: Electrical Cardioversion Indications:  Atrial Fibrillation  Procedure Details Consent: Risks of procedure as well as the alternatives and risks of each were explained to the (patient/caregiver).  Consent for procedure obtained. Time Out: Verified patient identification, verified procedure, site/side was marked, verified correct patient position, special equipment/implants available, medications/allergies/relevent history reviewed, required imaging and test results available.  Performed  1219  Patient placed on cardiac monitor, pulse oximetry, supplemental oxygen as necessary.  Sedation given:  propofol Pacer pads placed anterior and posterior chest.  Cardioverted 1 time(s).  Cardioverted at 120J.  Evaluation Findings: Post procedure EKG shows: NSR Complications: None Patient did tolerate procedure well.   Hillery Jacks 05/20/2022, 1:08 PM

## 2022-05-20 NOTE — Telephone Encounter (Signed)
Patient is returning call to discuss lab results. 

## 2022-05-20 NOTE — Op Note (Signed)
05/20/2022  12:23 PM  PATIENT:  Andrea Grant  61 y.o. female  PRE-OPERATIVE DIAGNOSIS:  a-fib  POST-OPERATIVE DIAGNOSIS:  Normal sinus rhythm  PROCEDURE:  Procedure(s): CARDIOVERSION (N/A)  PHYSICIAN: Surgeon(s) and Role:    * Brindy Higginbotham P, MD - Primary  ANESTHESIA:   general  PROCEDURE: Patient was brought to the procedure suite after appropriate consent was obtained. Defib pads were placed in the anterior and posterior positions. Sedation was managed by anesthesiology, for details please refer to their note. He was succesfully cardioverted to sinus rhythm with a since synchronized 200j shock. Cardiopulmonary monitoring was performed throughout the procedure, she tolerated well without complications.   RECOMMENDATIONS: Administer one dose of Eliquis '5mg'$  after DCCV. Resume regular scheduled dosing of Eliquis from 05/21/22.  PATIENT DISPOSITION:   Home, has a ride   Rayven Rettig Fidel Levy, MD Cardiology

## 2022-05-20 NOTE — Telephone Encounter (Signed)
Patient had DCCV today

## 2022-05-22 NOTE — Anesthesia Postprocedure Evaluation (Signed)
Anesthesia Post Note  Patient: Andrea Grant  Procedure(s) Performed: CARDIOVERSION  Patient location during evaluation: Phase II Anesthesia Type: General Level of consciousness: awake Pain management: pain level controlled Vital Signs Assessment: post-procedure vital signs reviewed and stable Respiratory status: spontaneous breathing and respiratory function stable Cardiovascular status: blood pressure returned to baseline and stable Postop Assessment: no headache and no apparent nausea or vomiting Anesthetic complications: no Comments: Late entry   No notable events documented.   Last Vitals:  Vitals:   05/20/22 1315 05/20/22 1330  BP: (!) 147/89 (!) 148/89  Pulse: (!) 58 63  Resp: 16 16  Temp:    SpO2: 98% 94%    Last Pain:  Vitals:   05/20/22 1300  TempSrc:   PainSc: 0-No pain                 Louann Sjogren

## 2022-05-24 ENCOUNTER — Telehealth: Payer: Self-pay | Admitting: Medical

## 2022-05-24 ENCOUNTER — Encounter (HOSPITAL_COMMUNITY): Payer: Self-pay | Admitting: Internal Medicine

## 2022-05-24 ENCOUNTER — Ambulatory Visit (HOSPITAL_COMMUNITY)
Admission: RE | Admit: 2022-05-24 | Discharge: 2022-05-24 | Disposition: A | Discharge status: Home / Self Care | Payer: Medicare Other | Source: Ambulatory Visit | Attending: Medical | Admitting: Medical

## 2022-05-24 DIAGNOSIS — I4811 Longstanding persistent atrial fibrillation: Secondary | ICD-10-CM | POA: Insufficient documentation

## 2022-05-24 LAB — ECHOCARDIOGRAM COMPLETE
AR max vel: 2.38 cm2
AV Area VTI: 2.44 cm2
AV Area mean vel: 2.39 cm2
AV Mean grad: 5.1 mmHg
AV Peak grad: 10.1 mmHg
Ao pk vel: 1.59 m/s
Area-P 1/2: 4.6 cm2
S' Lateral: 2.6 cm

## 2022-05-24 NOTE — Telephone Encounter (Signed)
Pt c/o medication issue:  1. Name of Medication: Amiodarone  2. How are you currently taking this medication (dosage and times per day)? 2 times a day  3. Are you having a reaction (difficulty breathing--STAT)?   4. What is your medication issue? Body hurting,and aching, legs feel weak and no sleep

## 2022-05-24 NOTE — Telephone Encounter (Signed)
Spoke with pt and she states that after starting Amiodarone she is stiff , weak and itching. Pt denies rash SOB and Chest pain. She states that sx did not start until yesterday. Please advise.

## 2022-05-24 NOTE — Telephone Encounter (Signed)
Patient called back stating she did not take any of the medication today as it made her feel too bad.  Patient would like to know her next steps.

## 2022-05-24 NOTE — Progress Notes (Signed)
*  PRELIMINARY RESULTS* Echocardiogram 2D Echocardiogram has been performed.  Andrea Grant 05/24/2022, 2:54 PM

## 2022-05-25 NOTE — Telephone Encounter (Signed)
Pt notified that she could stop taking Amiodarone. Pt voiced understanding

## 2022-06-03 ENCOUNTER — Other Ambulatory Visit: Payer: Self-pay | Admitting: Internal Medicine

## 2022-06-09 ENCOUNTER — Ambulatory Visit: Payer: Medicaid Other | Admitting: Student

## 2022-06-09 NOTE — Progress Notes (Deleted)
Cardiology Office Note    Date:  06/09/2022   ID:  Andrea Grant, DOB 1960/12/30, MRN [id number]  PCP:  Glenda Chroman, MD  Cardiologist: Carlyle Dolly, MD    No chief complaint on file.   History of Present Illness:    Andrea Grant is a 61 y.o. female with past medical history of CAD (catheterization in 07/2014 showing mild to moderate nonobstructive disease), persistent atrial fibrillation (diagnosed in 12/2020), HTN, HLD, Type II DM and OSA presents to the office today for follow-up from her recent cardioversion.  She was examined by Tarri Glenn, PA in 05/2022 and denied any recent chest pain or palpitations at that time.  BP had been elevated at times when checked at home.  She previously had a CPAP but was unable to use the machine.  She previously evaluated by the atrial fibrillation clinic in the prior year but plans for DCCV were delayed given an upcoming ophthalmology procedure.  Compliance with anticoagulation, it was recommended an attempt at cardioversion and this was arranged.  This was performed by Dr. Dellia Cloud on 05/20/2022 and she successfully converted back to normal sinus rhythm with a 200 J shock. She was started on Amiodarone 200 mg twice daily for 21 days following the procedure but called back reporting weakness and itching after taking the medication, therefore this was stopped on 05/25/2022.  Past Medical History:  Diagnosis Date   Anxiety disorder    CAD (coronary artery disease)    Cath 07/2014 with nonobstructive CAD mild to moderate, echo 07/2014 LVEF 60 to 65%.   Carpal tunnel syndrome, right    Cyst of skin    Depression    Hypercholesteremia    Hypertensive heart disease 07/27/2014   Insomnia    Lumbar radiculopathy    OSA (obstructive sleep apnea)    using CPAP sometimes   Persistent atrial fibrillation ([place])    Retinal detachment    Type 2 diabetes mellitus with peripheral neuropathy ([place]) 07/27/2014    Past Surgical History:   Procedure Laterality Date   CARDIOVERSION N/A 05/20/2022   Procedure: CARDIOVERSION;  Surgeon: Chalmers Guest, MD;  Location: [facility];  Service: Cardiovascular;  Laterality: N/A;   COLONOSCOPY WITH PROPOFOL N/A 03/31/2020   Procedure: COLONOSCOPY WITH PROPOFOL;  Surgeon: Daneil Dolin, MD;  Location: [facility] ENDO SU[facility];  Service: Endoscopy;  Laterality: N/A;  10:00am, pt can't come earlier, transportation   [place] N/A 07/29/2014   Procedure: LEFT HEART CATHETERIZATION WITH CORONARY ANGIOGRAM;  Surgeon: Sinclair Grooms, MD;  Location: [facility] CATH LAB;  Service: Cardiovascular;  Laterality: N/A;   POLYPECTOMY  03/31/2020   Procedure: POLYPECTOMY;  Surgeon: Daneil Dolin, MD;  Location: [facility];  Service: Endoscopy;;    Current Medications: Outpatient Medications Prior to Visit  Medication Sig Dispense Refill   [START ON 06/10/2022] amiodarone (PACERONE) 200 MG tablet Take 1 tablet (200 mg total) by mouth daily. 90 tablet 2   amiodarone (PACERONE) 200 MG tablet TAKE 1 TABLET BY MOUTH TWICE DAILY FOR 21 DAYS 42 tablet 0   amLODipine (NORVASC) 10 MG tablet Take 1 tablet (10 mg total) by mouth daily. 30 tablet 12   apixaban (ELIQUIS) 5 MG TABS tablet Take 1 tablet by mouth twice daily 60 tablet 3   atorvastatin (LIPITOR) 10 MG tablet Take 10 mg by mouth daily.     busPIRone (BUSPAR) 5 MG tablet Take 5 mg by mouth 2 (two) times daily. (  Patient not taking: Reported on 05/16/2022)     Carboxymethylcellulose Sodium (ARTIFICIAL TEARS OP) Place 1 drop into both eyes daily in the afternoon.     DULoxetine (CYMBALTA) 60 MG capsule Take 60 mg by mouth daily.     fexofenadine (ALLEGRA) 180 MG tablet Take 180 mg by mouth daily as needed for allergies or rhinitis.     furosemide (LASIX) 20 MG tablet Take one tablet by mouth for 3 days and if gains 3-5 pounds take one tablet as needed 10 tablet 0   gabapentin (NEURONTIN) 300 MG capsule Take 300 mg by mouth 3  (three) times daily.     glimepiride (AMARYL) 4 MG tablet Take 4 mg by mouth in the morning and at bedtime.      HUMALOG MIX 75/25 KWIKPEN (75-25) 100 UNIT/ML KwikPen Inject into the skin.     hydrALAZINE (APRESOLINE) 50 MG tablet Take 50 mg by mouth 2 (two) times daily.     JARDIANCE 10 MG TABS tablet Take 10 mg by mouth daily.     LORazepam (ATIVAN) 0.5 MG tablet Take 0.5 mg by mouth daily as needed.     metFORMIN (GLUCOPHAGE) 1000 MG tablet Take 1,000 mg by mouth 2 (two) times daily with a meal.     metoprolol tartrate (LOPRESSOR) 25 MG tablet Take 1 tablet (25 mg total) by mouth 2 (two) times daily. 180 tablet 3   potassium chloride (KLOR-CON) 10 MEQ tablet Take one tablet by mouth for 3 days with the lasix as needed 10 tablet 0   No facility-administered medications prior to visit.     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  Tobacco Use   Smoking status: Never   Smokeless tobacco: Never  Vaping Use   Vaping Use: Never used  Substance and Sexual Activity   Alcohol use: No    Alcohol/week: 0.0 standard drinks of alcohol   Drug use: No   Sexual activity: Never    Birth control/protection: Abstinence  Other Topics Concern   Not on file  Social History Narrative   Lives with 2 sons   Social Determinants of Health   Financial Resource Strain: Not on file  Food Insecurity: Not on file  Transportation Needs: Not on file  Physical Activity: Not on file  Stress: Not on file  Social Connections: Not on file     Family History:  The patient's ***family history includes CVA in her father; Cancer in her mother; Heart disease in her mother.   Review of Systems:    Please see the history of present illness.     All other  systems reviewed and are otherwise negative except as noted above.   Physical Exam:    VS:  LMP  (LMP Unknown)    General: Well developed, well nourished,female appearing in no acute distress. Head: Normocephalic, atraumatic. Neck: No carotid bruits. JVD not elevated.  Lungs: Respirations regular and unlabored, without wheezes or rales.  Heart: ***Regular rate and rhythm. No S3 or S4.  No murmur, no rubs, or gallops appreciated. Abdomen: Appears non-distended. No obvious abdominal masses. Msk:  Strength and tone appear normal for age. No obvious joint deformities or effusions. Extremities: No clubbing or  cyanosis. No edema.  Distal pedal pulses are 2+ bilaterally. Neuro: Alert and oriented X 3. Moves all extremities spontaneously. No focal deficits noted. Psych:  Responds to questions appropriately with a normal affect. Skin: No rashes or lesions noted  Wt Readings from Last 3 Encounters:  05/20/22 219 lb (99.3 kg)  05/16/22 219 lb (99.3 kg)  07/23/21 222 lb 1.9 oz (100.8 kg)        Studies/Labs Reviewed:   EKG:  EKG is*** ordered today.  The ekg ordered today demonstrates ***  Recent Labs: 05/19/2022: BUN 15; Creatinine, Ser 1.05; Hemoglobin 12.7; Platelets 271; Potassium 4.1; Sodium 138   Lipid Panel    Component Value Date/Time   CHOL 258 (H) 07/28/2014 0045   TRIG 185 (H) 07/28/2014 0045   HDL 36 (L) 07/28/2014 0045   CHOLHDL 7.2 07/28/2014 0045   VLDL 37 07/28/2014 0045   LDLCALC 185 (H) 07/28/2014 0045    Additional studies/ records that were reviewed today include:   Echocardiogram: 05/2022 IMPRESSIONS     1. Left ventricular ejection fraction, by estimation, is 65 to 70%. The  left ventricle has normal function. The left ventricle has no regional  wall motion abnormalities. There is mild left ventricular hypertrophy.  Left ventricular diastolic parameters  are indeterminate.   2. Right ventricular systolic function is normal. The right ventricular   size is normal. Tricuspid regurgitation signal is inadequate for assessing  PA pressure.   3. Left atrial size was moderately dilated.   4. The mitral valve is abnormal. Mild mitral valve regurgitation. No  evidence of mitral stenosis.   5. The aortic valve is tricuspid. Aortic valve regurgitation is not  visualized. No aortic stenosis is present.   6. The inferior vena cava is normal in size with greater than 50%  respiratory variability, suggesting right atrial pressure of 3 mmHg.   Assessment:    No diagnosis found.   Plan:   In order of problems listed above:  ***    Shared Decision Making/Informed Consent:   {Are you ordering a CV Procedure (e.g. stress test, cath, DCCV, TEE, etc)?   Press F2        :[id number]}    Medication Adjustments/Labs and Tests Ordered: Current medicines are reviewed at length with the patient today.  Concerns regarding medicines are outlined above.  Medication changes, Labs and Tests ordered today are listed in the Patient Instructions below. There are no Patient Instructions on file for this visit.   Signed, Erma Heritage, PA-C  06/09/2022 10:37 AM    [facility] [address] [place], [place] [address] Phone: [phone] Fax: [phone]

## 2022-07-30 ENCOUNTER — Other Ambulatory Visit: Payer: Self-pay | Admitting: Cardiology

## 2022-08-02 NOTE — Telephone Encounter (Signed)
Prescription refill request for Eliquis received. Indication: afib  Last office visit: Kathlen Mody, 05/16/2022 Scr:  1.05, 05/19/2022 Age: 62 yo  Weight: 99.3kg   Refill sent.

## 2022-08-10 ENCOUNTER — Telehealth: Payer: Self-pay | Admitting: Cardiology

## 2022-08-10 NOTE — Telephone Encounter (Signed)
Left a message for patient to call office back regarding medication concerns.

## 2022-08-10 NOTE — Telephone Encounter (Signed)
Pt c/o medication issue:  1. Name of Medication:    2. How are you currently taking this medication (dosage and times per day)?   3. Are you having a reaction (difficulty breathing--STAT)?   4. What is your medication issue?   Patient states she has been having issues with her medication but she does not know the name of it. She keeps saying that our office prescribed it and we know the name of it. I informed her that there are several medications on her current list. She began to search her purse for the bottle, but still cannot find it. She states that it was prescribed after having a procedure and she would like a call back from the nurse to discuss.

## 2022-08-10 NOTE — Telephone Encounter (Signed)
Patient returned CMA's call.  Patient stated she is having side-effects with her current medication - amiodarone (PACERONE) 200 MG tablet

## 2022-08-10 NOTE — Telephone Encounter (Signed)
Spoke with Pt who states that her BP is elevated. She states that at her office visit with another MD on yesterday her BP was 142/80. Pt BP with home cuff is 175/98. She is also requesting another medicine to replace the Amiodarone that was stopped 05/25/22 because of a adverse reaction ( body aches and bad dreams). Pt states that another medication was not prescribed at that time. Please advise.

## 2022-08-10 NOTE — Telephone Encounter (Signed)
Per Call Center:  Patient returned CMA's call.  Patient stated she is having side-effects with her current medication - amiodarone (PACERONE) 200 MG tablet

## 2022-08-15 NOTE — Telephone Encounter (Signed)
Returned call to pt. No answer. Left msg to call back.  

## 2022-08-15 NOTE — Telephone Encounter (Signed)
Amio was intended only for short course after cardioversion, does not need long term. If high bp's confirm taking hydralazine '50mg'$  bid if so increase to '75mg'$  bid  Zandra Abts MD

## 2022-08-16 NOTE — Telephone Encounter (Signed)
Andrea Grant is returning LPN's call. I confirmed the patient is currently taking hydralazine 50 MG twice a day. Please advise.

## 2022-08-17 MED ORDER — HYDRALAZINE HCL 50 MG PO TABS: 75.0000 mg | ORAL_TABLET | Freq: Two times a day (BID) | ORAL | 3 refills | Status: DC

## 2022-08-17 NOTE — Telephone Encounter (Signed)
Spoke with pt and she reports taking Hydralazine 50 mg BID. Pt notified of Dr. Nelly Laurence response and that Hydralazine will be increased to 75 mg two times daily. Pt voiced understanding.

## 2022-08-17 NOTE — Telephone Encounter (Signed)
Called patient with test results. No answer. Left message to call back.  

## 2022-09-08 ENCOUNTER — Encounter (HOSPITAL_COMMUNITY): Payer: Self-pay | Admitting: *Deleted

## 2022-09-21 NOTE — Progress Notes (Unsigned)
Cardiology Office Note   Date:  09/22/2022   ID:  [name], [name] 12-10-1960, MRN [id number]  PCP:  Glenda Chroman, MD  Cardiologist:  Dr. Harl Bowie     Chief Complaint  Patient presents with   Atrial Fibrillation   Hypertension      History of Present Illness: Andrea Grant is a 63 y.o. female who presents for CAD   She has a hx of CAD, HTN, OSA, HLD, DM2, Afib who presents for 6 month follow-up.   Patient was admitted in 07/2014 with chest pain and mild troponin elevation in the setting of severe HTN. Cath showed nonobstructive CAD. Echo showed normal LVEF.    She was diagnosed with Afib in 12/2020 and started on Eliquis and metoprolol. She saw the Afib clinic and lasix was started. CHADSVASC at least 3. Plan was to undergo cardioversion, but this was held due to ophthalmology procedure. She was lost to follow-up.   Last OV 05/16/22 BP elevated.  She does not tolerate CPAP.  Plan for DCCV after echo Echo 05/24/22 with EF 65-70% mild LVH  LA mod dilated.   Underwent DCCV 05/20/22 successfully she was placed on amiodarone but did not tolerate.  It was stopped.    Recent elevated BP so hydralazine increased to 75 BID.    Today.  BP controlled but pt tells me the hydralazine does not help.  She has been taking the 75 mg but 50 in AM and 100 in pm.   She is taking the lopressor 25 BID and she is also taking labetalol 100 she believes daily. Because other wise her BP is too high.  We discussed many options - she does not want to stop the lopressor.   She really does not wish to stop the labetalol. No chest pain, no SOB.  She is walking some for exercise.  She is sticking to a healthy diet as well.      Past Medical History:  Diagnosis Date   Anxiety disorder    CAD (coronary artery disease)    Cath 07/2014 with nonobstructive CAD mild to moderate, echo 07/2014 LVEF 60 to 65%.   Carpal tunnel syndrome, right    Cyst of skin    Depression    Hypercholesteremia     Hypertensive heart disease 07/27/2014   Insomnia    Lumbar radiculopathy    OSA (obstructive sleep apnea)    using CPAP sometimes   Persistent atrial fibrillation ([place])    Retinal detachment    Type 2 diabetes mellitus with peripheral neuropathy ([place]) 07/27/2014    Past Surgical History:  Procedure Laterality Date   CARDIOVERSION N/A 05/20/2022   Procedure: CARDIOVERSION;  Surgeon: Chalmers Guest, MD;  Location: [facility];  Service: Cardiovascular;  Laterality: N/A;   COLONOSCOPY WITH PROPOFOL N/A 03/31/2020   Procedure: COLONOSCOPY WITH PROPOFOL;  Surgeon: Daneil Dolin, MD;  Location: [facility];  Service: Endoscopy;  Laterality: N/A;  10:00am, pt can't come earlier, transportation   [place] N/A 07/29/2014   Procedure: LEFT HEART CATHETERIZATION WITH CORONARY ANGIOGRAM;  Surgeon: Sinclair Grooms, MD;  Location: [facility] CATH LAB;  Service: Cardiovascular;  Laterality: N/A;   POLYPECTOMY  03/31/2020   Procedure: POLYPECTOMY;  Surgeon: Daneil Dolin, MD;  Location: [facility];  Service: Endoscopy;;     Current Outpatient Medications  Medication Sig Dispense Refill   amLODipine (NORVASC) 10 MG tablet Take 1 tablet (10 mg total) by  mouth daily. 30 tablet 12   apixaban (ELIQUIS) 5 MG TABS tablet Take 1 tablet by mouth twice daily 60 tablet 3   atorvastatin (LIPITOR) 10 MG tablet Take 10 mg by mouth daily.     Carboxymethylcellulose Sodium (ARTIFICIAL TEARS OP) Place 1 drop into both eyes daily in the afternoon.     DULoxetine (CYMBALTA) 60 MG capsule Take 60 mg by mouth daily.     fexofenadine (ALLEGRA) 180 MG tablet Take 180 mg by mouth daily as needed for allergies or rhinitis.     furosemide (LASIX) 20 MG tablet Take one tablet by mouth for 3 days and if gains 3-5 pounds take one tablet as needed 10 tablet 0   gabapentin (NEURONTIN) 300 MG capsule Take 300 mg by mouth 3 (three) times daily.     glimepiride (AMARYL) 4 MG tablet Take 4  mg by mouth in the morning and at bedtime.      HUMALOG MIX 75/25 KWIKPEN (75-25) 100 UNIT/ML KwikPen Inject into the skin.     JARDIANCE 10 MG TABS tablet Take 10 mg by mouth daily.     metFORMIN (GLUCOPHAGE) 1000 MG tablet Take 1,000 mg by mouth 2 (two) times daily with a meal.     potassium chloride (KLOR-CON) 10 MEQ tablet Take one tablet by mouth for 3 days with the lasix as needed 10 tablet 0   busPIRone (BUSPAR) 5 MG tablet Take 5 mg by mouth 2 (two) times daily. (Patient not taking: Reported on 05/16/2022)     hydrALAZINE (APRESOLINE) 50 MG tablet Take 1 tablet (50 mg total) by mouth 2 (two) times daily. 180 tablet 3   LORazepam (ATIVAN) 0.5 MG tablet Take 0.5 mg by mouth daily as needed. (Patient not taking: Reported on 09/22/2022)     metoprolol tartrate (LOPRESSOR) 25 MG tablet Take 3 tablets (75 mg total) by mouth 2 (two) times daily. 360 tablet 3   No current facility-administered medications for this visit.    Allergies:   Amiodarone, Percocet [oxycodone-acetaminophen], and Penicillins    Social History:  The patient  reports that she has never smoked. She has never used smokeless tobacco. She reports that she does not drink alcohol and does not use drugs.   Family History:  The patient's family history includes CVA in her father; Cancer in her mother; Heart disease in her mother.    ROS:  General:no colds or fevers, no weight changes Skin:no rashes or ulcers HEENT:no blurred vision, no congestion CV:see HPI PUL:see HPI GI:no diarrhea constipation or melena, no indigestion GU:no hematuria, no dysuria MS:no joint pain, no claudication Neuro:no syncope, no lightheadedness Endo:+ diabetes, no thyroid disease  Wt Readings from Last 3 Encounters:  09/22/22 220 lb (99.8 kg)  05/20/22 219 lb (99.3 kg)  05/16/22 219 lb (99.3 kg)     PHYSICAL EXAM: VS:  BP (!) 104/48   Pulse 85   Ht 5' 4"$  (1.626 m)   Wt 220 lb (99.8 kg)   LMP  (LMP Unknown)   SpO2 95%   BMI 37.76  kg/m  , BMI Body mass index is 37.76 kg/m. General:Pleasant affect, NAD Skin:Warm and dry, brisk capillary refill HEENT:normocephalic, sclera clear, mucus membranes moist Neck:supple, no JVD, no bruits  Heart:S1S2 RRR without murmur, gallup, rub or click Lungs:clear without rales, rhonchi, or wheezes [id number], non tender, + BS, do not palpate liver spleen or masses Ext:no lower ext edema, 2+ pedal pulses, 2+ radial pulses Neuro:alert and oriented X 3, MAE, follows commands, +  facial symmetry    EKG:  EKG is ordered today. The ekg ordered today demonstrates SR non specific ST and T wave abnormality stable EKG   Recent Labs: 05/19/2022: BUN 15; Creatinine, Ser 1.05; Hemoglobin 12.7; Platelets 271; Potassium 4.1; Sodium 138    Lipid Panel    Component Value Date/Time   CHOL 258 (H) 07/28/2014 0045   TRIG 185 (H) 07/28/2014 0045   HDL 36 (L) 07/28/2014 0045   CHOLHDL 7.2 07/28/2014 0045   VLDL 37 07/28/2014 0045   LDLCALC 185 (H) 07/28/2014 0045       Other studies Reviewed: Additional studies/ records that were reviewed today include: . IMPRESSIONS     1. Left ventricular ejection fraction, by estimation, is 65 to 70%. The  left ventricle has normal function. The left ventricle has no regional  wall motion abnormalities. There is mild left ventricular hypertrophy.  Left ventricular diastolic parameters  are indeterminate.   2. Right ventricular systolic function is normal. The right ventricular  size is normal. Tricuspid regurgitation signal is inadequate for assessing  PA pressure.   3. Left atrial size was moderately dilated.   4. The mitral valve is abnormal. Mild mitral valve regurgitation. No  evidence of mitral stenosis.   5. The aortic valve is tricuspid. Aortic valve regurgitation is not  visualized. No aortic stenosis is present.   6. The inferior vena cava is normal in size with greater than 50%  respiratory variability, suggesting right atrial pressure  of 3 mmHg.   FINDINGS   Left Ventricle: Left ventricular ejection fraction, by estimation, is 65  to 70%. The left ventricle has normal function. The left ventricle has no  regional wall motion abnormalities. The left ventricular internal cavity  size was normal in size. There is   mild left ventricular hypertrophy. Left ventricular diastolic parameters  are indeterminate.   Right Ventricle: The right ventricular size is normal. Right vetricular  wall thickness was not well visualized. Right ventricular systolic  function is normal. Tricuspid regurgitation signal is inadequate for  assessing PA pressure.   Left Atrium: Left atrial size was moderately dilated.   Right Atrium: Right atrial size was normal in size.   Pericardium: There is no evidence of pericardial effusion.   Mitral Valve: The mitral valve is abnormal. Mild mitral valve  regurgitation. No evidence of mitral valve stenosis.   Tricuspid Valve: The tricuspid valve is normal in structure. Tricuspid  valve regurgitation is trivial. No evidence of tricuspid stenosis.   ASSESSMENT AND PLAN:  1.  Atrial fib persistent maintaining SR since DCCV 05/20/22 she was on amiodarone for short time frame afterwards but had side effects of nightmares.    She has not felt any atrial fib since DCCV.  She is on eliquis and no bleeding on this. Follow up in 3 weeks to recheck BP   2.  HTN has been elevated at home,  she is taking labetalol and metoprolol we discussed a plan.  She agreed to stop labetalol and I increased the lopressor to 75 BID and decreased her hydralazine back to 50 BID.  She will monitor her BP at home and call if > [id number] systolic.  I would then increase lopressor to 100 BID.  Her HR was good at 85 today as well.  3.  HLD per PCP last LDL was 118 but with known CAD goal would be <70 will increase lipitor to 40 mg daily and recheck hepatic and lipids in 8 weeks.  Labs  from PCP reviewed  4. CAD with cath in 2015 goal LDL <70  , she had non obstructive disease but with DM would like to control lipids  5.  OSA does not tolerate CPAP.    6.  DM-2 per PCP     Current medicines are reviewed with the patient today.  The patient Has no concerns regarding medicines.  The following changes have been made:  See above Labs/ tests ordered today include:see above  Disposition:   FU:  see above  Signed, Cecilie Kicks, NP  09/22/2022 4:38 PM    [facility] [place], [place], [place], [place] Phone: [phone]; Fax: [phone]

## 2022-09-22 ENCOUNTER — Encounter: Payer: Self-pay | Admitting: Cardiology

## 2022-09-22 ENCOUNTER — Ambulatory Visit: Payer: 59 | Attending: Cardiology | Admitting: Cardiology

## 2022-09-22 VITALS — BP 104/48 | HR 85 | Ht 64.0 in | Wt 220.0 lb

## 2022-09-22 DIAGNOSIS — E782 Mixed hyperlipidemia: Secondary | ICD-10-CM | POA: Diagnosis not present

## 2022-09-22 DIAGNOSIS — I2510 Atherosclerotic heart disease of native coronary artery without angina pectoris: Secondary | ICD-10-CM

## 2022-09-22 DIAGNOSIS — E1142 Type 2 diabetes mellitus with diabetic polyneuropathy: Secondary | ICD-10-CM

## 2022-09-22 DIAGNOSIS — I4811 Longstanding persistent atrial fibrillation: Secondary | ICD-10-CM | POA: Diagnosis not present

## 2022-09-22 DIAGNOSIS — G4733 Obstructive sleep apnea (adult) (pediatric): Secondary | ICD-10-CM

## 2022-09-22 MED ORDER — METOPROLOL TARTRATE 25 MG PO TABS: 75.0000 mg | ORAL_TABLET | Freq: Two times a day (BID) | ORAL | 3 refills | Status: DC

## 2022-09-22 MED ORDER — HYDRALAZINE HCL 50 MG PO TABS: 50.0000 mg | ORAL_TABLET | Freq: Two times a day (BID) | ORAL | 3 refills | Status: DC

## 2022-09-22 NOTE — Patient Instructions (Addendum)
Medication Instructions:  Your physician has recommended you make the following change in your medication:  -Stop Labetalol -Decrease Hydralazine to 50 mg tablets twice dialy -Increase Metoprolol to 75 mg twice daily.  *If you need a refill on your cardiac medications before your next appointment, please call your pharmacy*   Lab Work: None If you have labs (blood work) drawn today and your tests are completely normal, you will receive your results only by: [place] (if you have MyChart) OR A paper copy in the mail If you have any lab test that is abnormal or we need to change your treatment, we will call you to review the results.   Testing/Procedures: None   Follow-Up: At [facility], you and your health needs are our priority.  As part of our continuing mission to provide you with exceptional heart care, we have created designated Provider Care Teams.  These Care Teams include your primary Cardiologist (physician) and Advanced Practice Providers (APPs -  Physician Assistants and Nurse Practitioners) who all work together to provide you with the care you need, when you need it.  We recommend signing up for the patient portal called "MyChart".  Sign up information is provided on this After Visit Summary.  MyChart is used to connect with patients for Virtual Visits (Telemedicine).  Patients are able to view lab/test results, encounter notes, upcoming appointments, etc.  Non-urgent messages can be sent to your provider as well.   To learn more about what you can do with MyChart, go to [email or web address].    Your next appointment:   3 week(s)  Provider:   You will see one of the following Advanced Practice Providers on your designated Care Team:   Melina Copa, [place] Cecilie Kicks, NP [place] Kathlen Mody, PA-C Gerrie Nordmann, NP  Ermalinda Barrios, PA-C   Other Instructions Your physician has requested that you regularly monitor and record your blood pressure readings at  home. Please use the same machine at the same time of day to check your readings and record them to bring to your follow-up visit.

## 2022-09-23 ENCOUNTER — Telehealth: Payer: Self-pay

## 2022-09-23 DIAGNOSIS — E782 Mixed hyperlipidemia: Secondary | ICD-10-CM

## 2022-09-23 MED ORDER — ATORVASTATIN CALCIUM 40 MG PO TABS: 40.0000 mg | ORAL_TABLET | Freq: Every day | ORAL | 3 refills | Status: DC

## 2022-09-23 NOTE — Telephone Encounter (Signed)
-----   Message from Isaiah Serge, NP sent at 09/22/2022  4:49 PM EST ----- Please increase Ms Andrea Grant to 13 of lipitor her LDL goal is 53 and she was 118 on last check in Jan   thanks Check hepatic and lipids in 8 weeks to follow up

## 2022-10-12 ENCOUNTER — Telehealth: Payer: Self-pay | Admitting: Cardiology

## 2022-10-12 NOTE — Telephone Encounter (Signed)
Pt c/o BP issue: STAT if pt c/o blurred vision, one-sided weakness or slurred speech  1. What are your last 5 BP readings?  164/91 164/90 159/85 175/98 159/97    2. Are you having any other symptoms (ex. Dizziness, headache, blurred vision, passed out)? "Kind of blurred vision and a little headache"   3. What is your BP issue? Pt states her bp has been high and she's been experiencing some blurred vision. She states her medication was changed recently but doesn't believe its working very well.

## 2022-10-12 NOTE — Telephone Encounter (Signed)
Spoke to pt who stated that since her medications were adjusted, her blood pressure has been trending higher. Patient stated that her Metoprolol was increased and Labetalol was stopped. Pt stated that she wants to restart labetalol as she had no tissues while being on both the beta blockers- issues started after medications were changed.   Please advise.

## 2022-10-13 NOTE — Telephone Encounter (Signed)
Patient is requesting call back to discuss her medication and get update on her previous medication.

## 2022-10-17 NOTE — Telephone Encounter (Signed)
Left a message for patient to call office back regarding medication review from provider.

## 2022-10-19 NOTE — Progress Notes (Deleted)
Cardiology Office Note   Date:  10/19/2022   ID:  [name], DOB May 27, 1961, MRN [id number]  PCP:  Glenda Chroman, MD  Cardiologist:  Dr. Harl Bowie    No chief complaint on file.     History of Present Illness: Andrea Grant is a 62 y.o. female who presents for ***  She has a hx of CAD, HTN, OSA, HLD, DM2, Afib who presents for 6 month follow-up.   Patient was admitted in 07/2014 with chest pain and mild troponin elevation in the setting of severe HTN. Cath showed nonobstructive CAD. Echo showed normal LVEF.    She was diagnosed with Afib in 12/2020 and started on Eliquis and metoprolol. She saw the Afib clinic and lasix was started. CHADSVASC at least 3. Plan was to undergo cardioversion, but this was held due to ophthalmology procedure. She was lost to follow-up.    Last OV 05/16/22 BP elevated.  She does not tolerate CPAP.  Plan for DCCV after echo Echo 05/24/22 with EF 65-70% mild LVH  LA mod dilated.    Underwent DCCV 05/20/22 successfully she was placed on amiodarone but did not tolerate.  It was stopped.     Recent elevated BP so hydralazine increased to 75 BID.     Today.  BP controlled but pt tells me the hydralazine does not help.  She has been taking the 75 mg but 50 in AM and 100 in pm.   She is taking the lopressor 25 BID and she is also taking labetalol 100 she believes daily. Because other wise her BP is too high.  We discussed many options - she does not want to stop the lopressor.   She really does not wish to stop the labetalol. No chest pain, no SOB.  She is walking some for exercise.  She is sticking to a healthy diet as well.      BP has been elevated and we increased lopressor.  Past Medical History:  Diagnosis Date   Anxiety disorder    CAD (coronary artery disease)    Cath 07/2014 with nonobstructive CAD mild to moderate, echo 07/2014 LVEF 60 to 65%.   Carpal tunnel syndrome, right    Cyst of skin    Depression    Hypercholesteremia     Hypertensive heart disease 07/27/2014   Insomnia    Lumbar radiculopathy    OSA (obstructive sleep apnea)    using CPAP sometimes   Persistent atrial fibrillation ([place])    Retinal detachment    Type 2 diabetes mellitus with peripheral neuropathy ([place]) 07/27/2014    Past Surgical History:  Procedure Laterality Date   CARDIOVERSION N/A 05/20/2022   Procedure: CARDIOVERSION;  Surgeon: Chalmers Guest, MD;  Location: [facility] ORS;  Service: Cardiovascular;  Laterality: N/A;   COLONOSCOPY WITH PROPOFOL N/A 03/31/2020   Procedure: COLONOSCOPY WITH PROPOFOL;  Surgeon: Daneil Dolin, MD;  Location: [facility] ENDO SUITE;  Service: Endoscopy;  Laterality: N/A;  10:00am, pt can't come earlier, transportation   [place] N/A 07/29/2014   Procedure: LEFT HEART CATHETERIZATION WITH CORONARY ANGIOGRAM;  Surgeon: Sinclair Grooms, MD;  Location: [facility] CATH LAB;  Service: Cardiovascular;  Laterality: N/A;   POLYPECTOMY  03/31/2020   Procedure: POLYPECTOMY;  Surgeon: Daneil Dolin, MD;  Location: [facility] ENDO SUITE;  Service: Endoscopy;;     Current Outpatient Medications  Medication Sig Dispense Refill   amLODipine (NORVASC) 10 MG tablet Take 1 tablet (10 mg  total) by mouth daily. 30 tablet 12   apixaban (ELIQUIS) 5 MG TABS tablet Take 1 tablet by mouth twice daily 60 tablet 3   atorvastatin (LIPITOR) 40 MG tablet Take 1 tablet (40 mg total) by mouth daily. 90 tablet 3   busPIRone (BUSPAR) 5 MG tablet Take 5 mg by mouth 2 (two) times daily. (Patient not taking: Reported on 05/16/2022)     Carboxymethylcellulose Sodium (ARTIFICIAL TEARS OP) Place 1 drop into both eyes daily in the afternoon.     DULoxetine (CYMBALTA) 60 MG capsule Take 60 mg by mouth daily.     fexofenadine (ALLEGRA) 180 MG tablet Take 180 mg by mouth daily as needed for allergies or rhinitis.     furosemide (LASIX) 20 MG tablet Take one tablet by mouth for 3 days and if gains 3-5 pounds take one tablet as  needed 10 tablet 0   gabapentin (NEURONTIN) 300 MG capsule Take 300 mg by mouth 3 (three) times daily.     glimepiride (AMARYL) 4 MG tablet Take 4 mg by mouth in the morning and at bedtime.      HUMALOG MIX 75/25 KWIKPEN (75-25) 100 UNIT/ML KwikPen Inject into the skin.     hydrALAZINE (APRESOLINE) 50 MG tablet Take 1 tablet (50 mg total) by mouth 2 (two) times daily. 180 tablet 3   JARDIANCE 10 MG TABS tablet Take 10 mg by mouth daily.     LORazepam (ATIVAN) 0.5 MG tablet Take 0.5 mg by mouth daily as needed. (Patient not taking: Reported on 09/22/2022)     metFORMIN (GLUCOPHAGE) 1000 MG tablet Take 1,000 mg by mouth 2 (two) times daily with a meal.     metoprolol tartrate (LOPRESSOR) 25 MG tablet Take 3 tablets (75 mg total) by mouth 2 (two) times daily. 360 tablet 3   potassium chloride (KLOR-CON) 10 MEQ tablet Take one tablet by mouth for 3 days with the lasix as needed 10 tablet 0   No current facility-administered medications for this visit.    Allergies:   Amiodarone, Percocet [oxycodone-acetaminophen], and Penicillins    Social History:  The patient  reports that she has never smoked. She has never used smokeless tobacco. She reports that she does not drink alcohol and does not use drugs.   Family History:  The patient's ***family history includes CVA in her father; Cancer in her mother; Heart disease in her mother.    ROS:  General:no colds or fevers, no weight changes Skin:no rashes or ulcers HEENT:no blurred vision, no congestion CV:see HPI PUL:see HPI GI:no diarrhea constipation or melena, no indigestion GU:no hematuria, no dysuria MS:no joint pain, no claudication Neuro:no syncope, no lightheadedness Endo:no diabetes, no thyroid disease Wt Readings from Last 3 Encounters:  09/22/22 220 lb (99.8 kg)  05/20/22 219 lb (99.3 kg)  05/16/22 219 lb (99.3 kg)     PHYSICAL EXAM: VS:  LMP  (LMP Unknown)  , BMI There is no height or weight on file to calculate  BMI. General:Pleasant affect, NAD Skin:Warm and dry, brisk capillary refill HEENT:normocephalic, sclera clear, mucus membranes moist Neck:supple, no JVD, no bruits  Heart:S1S2 RRR without murmur, gallup, rub or click Lungs:clear without rales, rhonchi, or wheezes ZSW:FUXN, non tender, + BS, do not palpate liver spleen or masses Ext:no lower ext edema, 2+ pedal pulses, 2+ radial pulses Neuro:alert and oriented, MAE, follows commands, + facial symmetry    EKG:  EKG is ordered today. The ekg ordered today demonstrates ***   Recent Labs: 05/19/2022: BUN  15; Creatinine, Ser 1.05; Hemoglobin 12.7; Platelets 271; Potassium 4.1; Sodium 138    Lipid Panel    Component Value Date/Time   CHOL 258 (H) 07/28/2014 0045   TRIG 185 (H) 07/28/2014 0045   HDL 36 (L) 07/28/2014 0045   CHOLHDL 7.2 07/28/2014 0045   VLDL 37 07/28/2014 0045   LDLCALC 185 (H) 07/28/2014 0045       Other studies Reviewed: Additional studies/ records that were reviewed today include: ***. IMPRESSIONS     1. Left ventricular ejection fraction, by estimation, is 65 to 70%. The  left ventricle has normal function. The left ventricle has no regional  wall motion abnormalities. There is mild left ventricular hypertrophy.  Left ventricular diastolic parameters  are indeterminate.   2. Right ventricular systolic function is normal. The right ventricular  size is normal. Tricuspid regurgitation signal is inadequate for assessing  PA pressure.   3. Left atrial size was moderately dilated.   4. The mitral valve is abnormal. Mild mitral valve regurgitation. No  evidence of mitral stenosis.   5. The aortic valve is tricuspid. Aortic valve regurgitation is not  visualized. No aortic stenosis is present.   6. The inferior vena cava is normal in size with greater than 50%  respiratory variability, suggesting right atrial pressure of 3 mmHg.   FINDINGS   Left Ventricle: Left ventricular ejection fraction, by  estimation, is 65  to 70%. The left ventricle has normal function. The left ventricle has no  regional wall motion abnormalities. The left ventricular internal cavity  size was normal in size. There is   mild left ventricular hypertrophy. Left ventricular diastolic parameters  are indeterminate.   Right Ventricle: The right ventricular size is normal. Right vetricular  wall thickness was not well visualized. Right ventricular systolic  function is normal. Tricuspid regurgitation signal is inadequate for  assessing PA pressure.   Left Atrium: Left atrial size was moderately dilated.   Right Atrium: Right atrial size was normal in size.   Pericardium: There is no evidence of pericardial effusion.   Mitral Valve: The mitral valve is abnormal. Mild mitral valve  regurgitation. No evidence of mitral valve stenosis.   Tricuspid Valve: The tricuspid valve is normal in structure. Tricuspid  valve regurgitation is trivial. No evidence of tricuspid stenosis.     ASSESSMENT AND PLAN:  1.  ***   Current medicines are reviewed with the patient today.  The patient Has no concerns regarding medicines.  The following changes have been made:  See above Labs/ tests ordered today include:see above  Disposition:   FU:  see above  Signed, Cecilie Kicks, NP  10/19/2022 8:51 PM    [facility] [place], [address] [place], [place] Phone: [phone]; Fax: [phone]

## 2022-10-20 ENCOUNTER — Ambulatory Visit: Payer: 59 | Admitting: Cardiology

## 2022-10-24 NOTE — Telephone Encounter (Signed)
Left a message for patient to call office back regarding medication review from provider.

## 2022-10-25 MED ORDER — METOPROLOL TARTRATE 100 MG PO TABS: 100.0000 mg | ORAL_TABLET | Freq: Two times a day (BID) | ORAL | 3 refills | Status: DC

## 2022-10-25 NOTE — Telephone Encounter (Signed)
Left a message for patient to call office back regarding medication review from provider.

## 2022-10-26 NOTE — Telephone Encounter (Signed)
Ok per DPR to leave detailed message on pt's home/cell phone.  Left message letting pt know that provider increased Lopressor to 100 mg BID, but to stay off of Labetalol. If pt has any questions, she may call our office back at [phone].

## 2022-10-31 ENCOUNTER — Other Ambulatory Visit: Payer: Self-pay | Admitting: *Deleted

## 2022-10-31 MED ORDER — APIXABAN 5 MG PO TABS: 5.0000 mg | ORAL_TABLET | Freq: Two times a day (BID) | ORAL | 5 refills | Status: DC

## 2022-10-31 NOTE — Telephone Encounter (Signed)
Prescription refill request for Eliquis received. Indication: AF Last office visit: 09/22/22  Serita Butcher NP Scr: 1.05 on 05/19/22  Epic Age: 62 Weight: 99.8kg  Based on above findings Eliquis 5mg  twice daily is the appropriate dose.  Refill approved.

## 2022-10-31 NOTE — Telephone Encounter (Signed)
Left a message for patient to call office back .  

## 2022-11-01 NOTE — Telephone Encounter (Signed)
Left a message for patient to call office in regards to bp.

## 2022-11-02 NOTE — Telephone Encounter (Signed)
Left a message for patient to call office in regards to bp.

## 2022-11-03 ENCOUNTER — Telehealth: Payer: Self-pay | Admitting: Cardiology

## 2022-11-03 NOTE — Telephone Encounter (Signed)
Pt missed last appt.  We have tried to contact but no answer.

## 2023-02-06 ENCOUNTER — Other Ambulatory Visit: Payer: Self-pay | Admitting: Internal Medicine

## 2023-02-06 DIAGNOSIS — Z1231 Encounter for screening mammogram for malignant neoplasm of breast: Secondary | ICD-10-CM

## 2023-02-07 ENCOUNTER — Ambulatory Visit
Admission: RE | Admit: 2023-02-07 | Discharge: 2023-02-07 | Disposition: A | Discharge status: Home / Self Care | Payer: 59 | Source: Ambulatory Visit | Attending: Internal Medicine | Admitting: Internal Medicine

## 2023-02-07 DIAGNOSIS — Z1231 Encounter for screening mammogram for malignant neoplasm of breast: Secondary | ICD-10-CM

## 2023-04-11 IMAGING — MG MM DIGITAL SCREENING BILAT W/ TOMO AND CAD
8 series · 8 of 24 positions shown · non-contrast
Comparison: Previous exam(s).

CLINICAL DATA: Screening.

EXAM:
DIGITAL SCREENING BILATERAL MAMMOGRAM WITH TOMOSYNTHESIS AND CAD
TECHNIQUE: Bilateral screening digital craniocaudal and mediolateral oblique
mammograms were obtained. Bilateral screening digital breast
tomosynthesis was performed. The images were evaluated with
computer-aided detection.

[R MLO synth-2D]
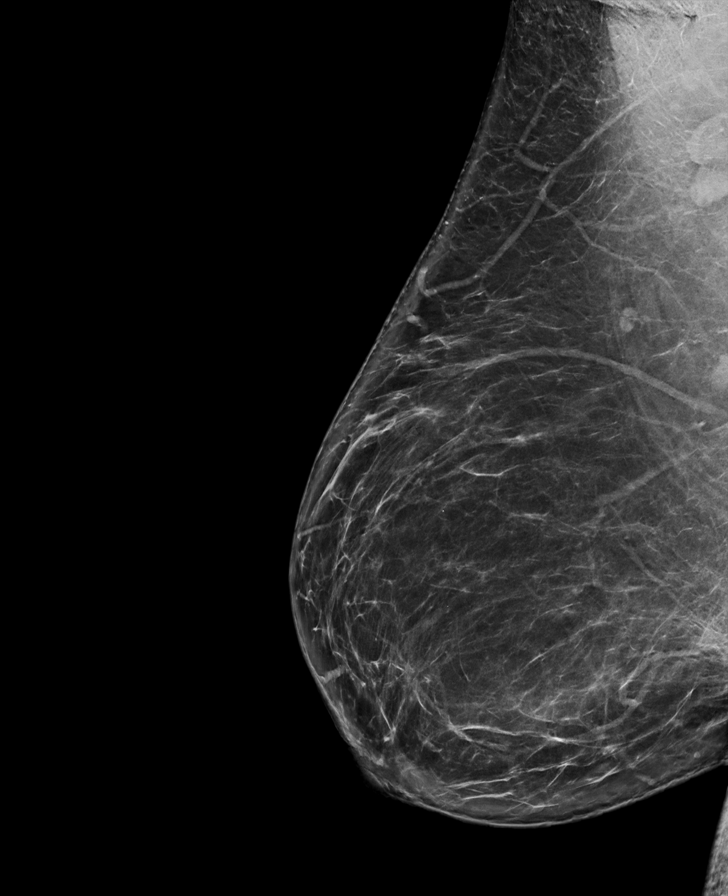

[L MLO synth-2D]
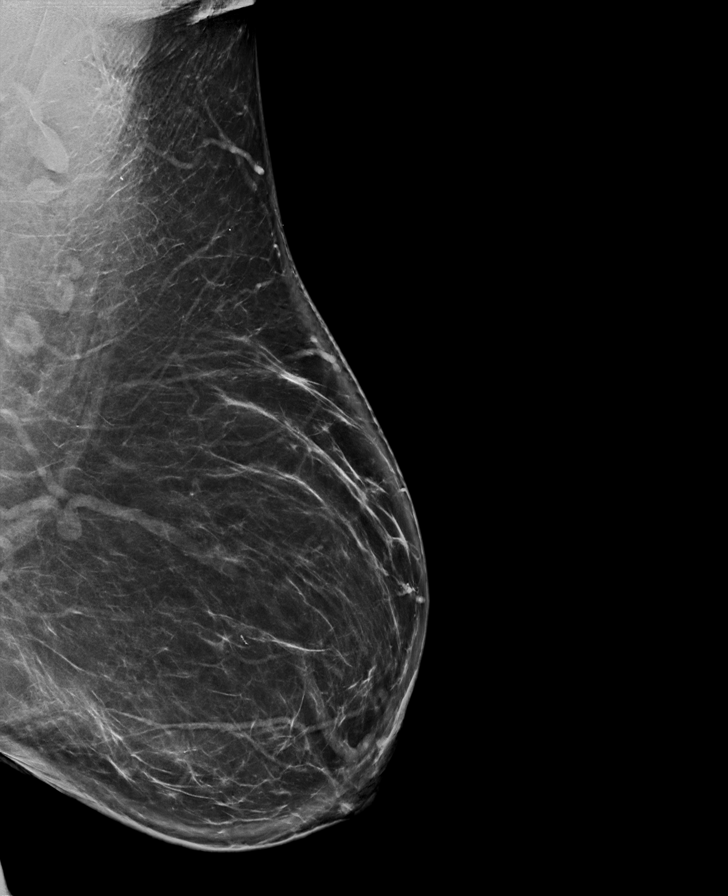

[R CC synth-2D]
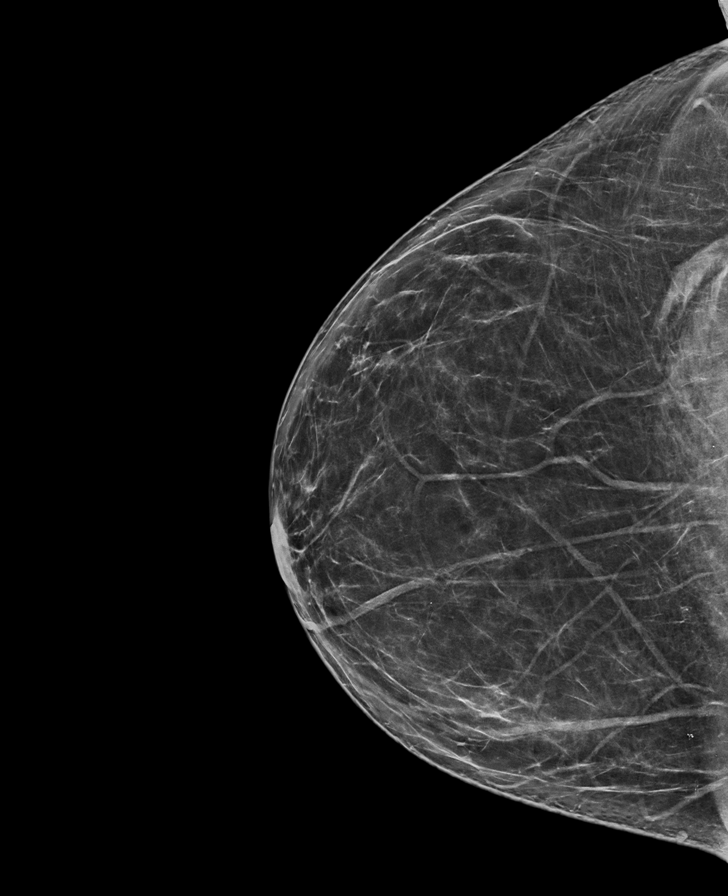

[L CC synth-2D]
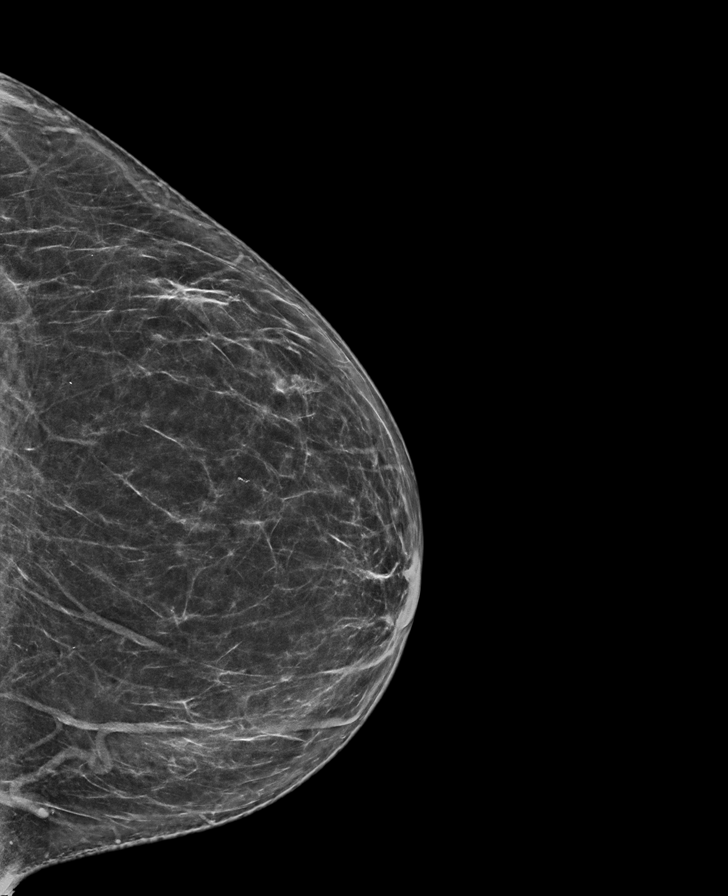

[L CC tomo · tomo slice 39/77.0]
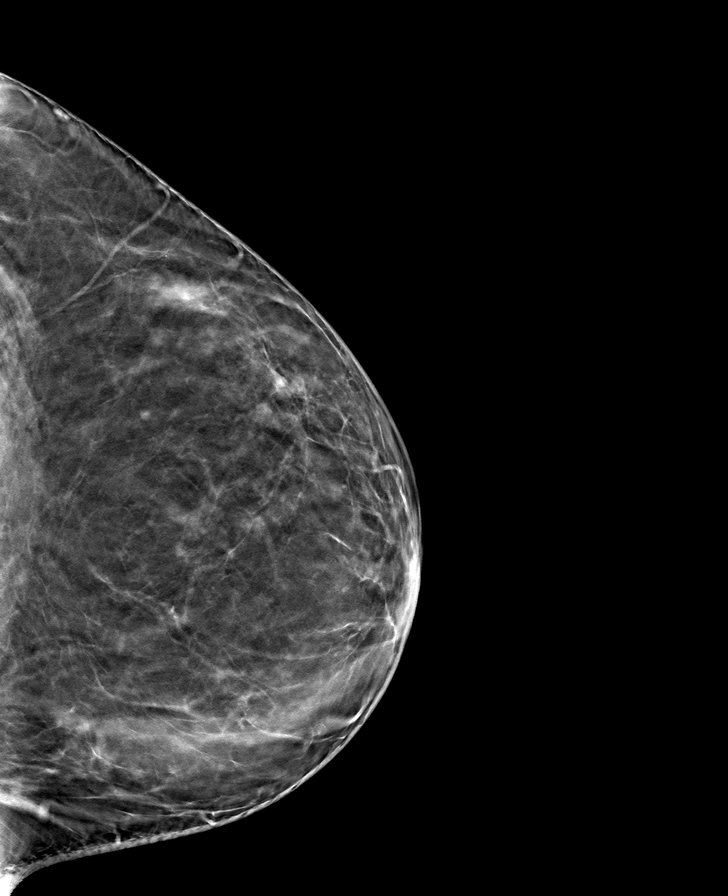

[R MLO tomo · tomo slice 46/91.0]
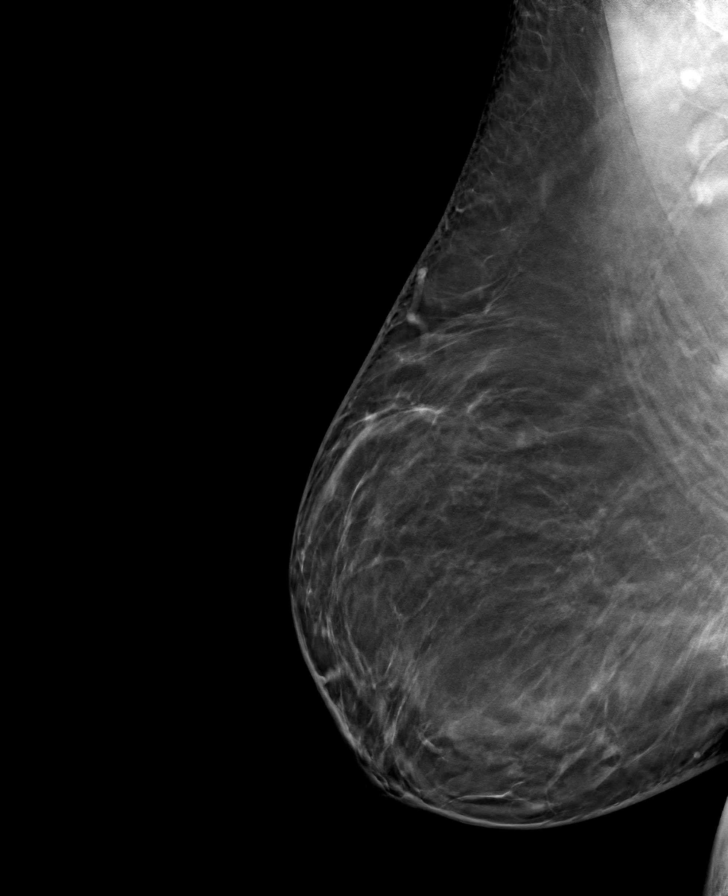

[L MLO tomo · tomo slice 47/93.0]
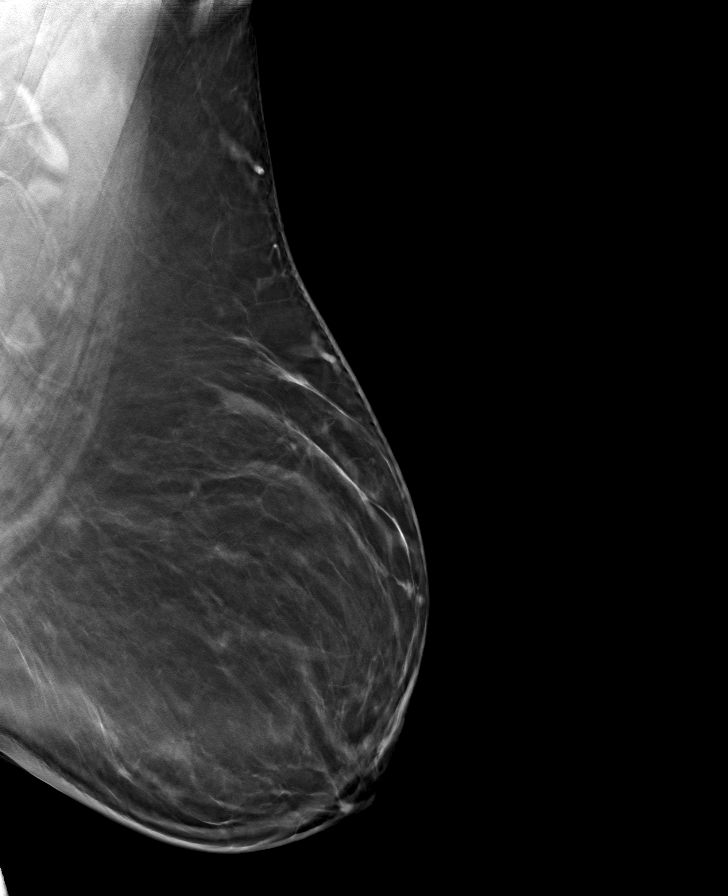

[R CC tomo · tomo slice 40/79.0]
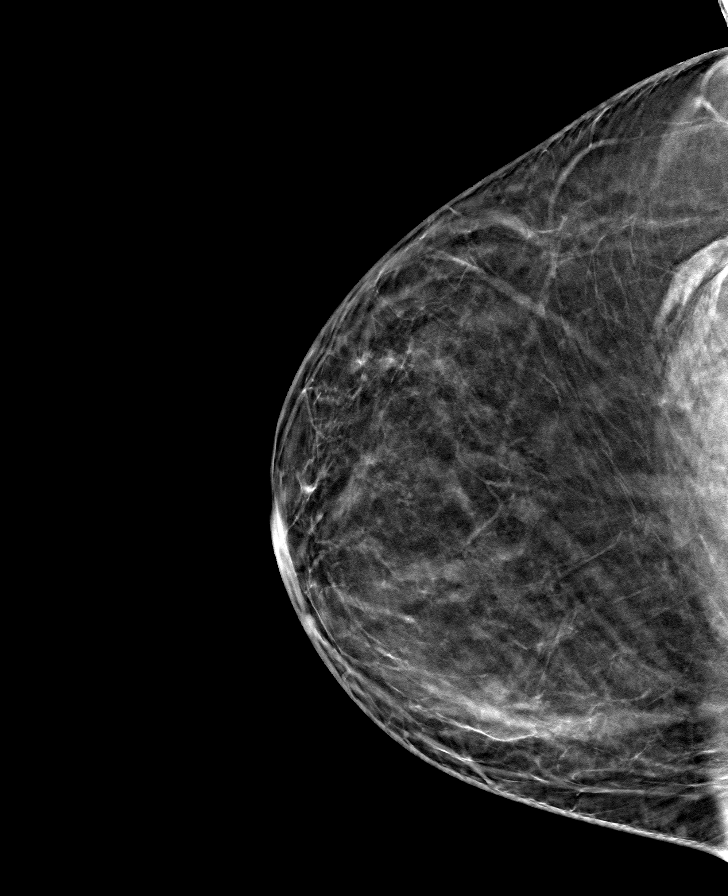

[8 of 24 positions shown; findings below may reference images not displayed]

ACR Breast Density Category b: There are scattered areas of
fibroglandular density.
FINDINGS: There are no findings suspicious for malignancy.
IMPRESSION: No mammographic evidence of malignancy. A result letter of this
screening mammogram will be mailed directly to the patient.

RECOMMENDATION:
Screening mammogram in one year. (Code:51-O-LD2)

BI-RADS CATEGORY  1: Negative.

## 2023-05-11 ENCOUNTER — Other Ambulatory Visit: Payer: Self-pay | Admitting: *Deleted

## 2023-05-11 MED ORDER — APIXABAN 5 MG PO TABS: 5.0000 mg | ORAL_TABLET | Freq: Two times a day (BID) | ORAL | 1 refills | Status: DC

## 2023-05-11 NOTE — Telephone Encounter (Signed)
Prescription refill request for Eliquis received. Indication: AF Last office visit: 09/22/22  Sherian Rein NP Scr: 1.07 on 12/21/22  KPN Age: 62 Weight: 99.8kg  Based on above findings Eliquis 5mg  twice daily is the appropriate dose.  Refill approved.

## 2023-10-13 ENCOUNTER — Other Ambulatory Visit: Payer: Self-pay

## 2023-10-13 MED ORDER — METOPROLOL TARTRATE 100 MG PO TABS: 100.0000 mg | ORAL_TABLET | Freq: Two times a day (BID) | ORAL | 0 refills | Status: DC

## 2023-10-16 ENCOUNTER — Other Ambulatory Visit: Payer: Self-pay

## 2023-10-16 ENCOUNTER — Encounter: Payer: Self-pay | Admitting: Nurse Practitioner

## 2023-10-16 MED ORDER — HYDRALAZINE HCL 50 MG PO TABS: 50.0000 mg | ORAL_TABLET | Freq: Two times a day (BID) | ORAL | 0 refills | Status: DC

## 2023-10-16 NOTE — Telephone Encounter (Signed)
 Needs apt for refills, was supposed to follow last year-3 weeks after 10/2022 apt  30 day refill hydralazine sent to [facility]  Message sent to scheduling

## 2023-11-06 ENCOUNTER — Other Ambulatory Visit: Payer: Self-pay | Admitting: Cardiology

## 2023-11-06 NOTE — Telephone Encounter (Signed)
 Prescription refill request for Eliquis received. Indication:AFIB Last office visit:upcoming Scr:1.07  5/24 Age: 63 Weight:99.8  kg  Prescription refilled

## 2023-11-13 ENCOUNTER — Other Ambulatory Visit: Payer: Self-pay

## 2023-11-13 MED ORDER — ATORVASTATIN CALCIUM 40 MG PO TABS: 40.0000 mg | ORAL_TABLET | Freq: Every day | ORAL | 0 refills | Status: DC

## 2023-11-15 ENCOUNTER — Other Ambulatory Visit: Payer: Self-pay | Admitting: Cardiology

## 2023-11-24 ENCOUNTER — Encounter: Payer: Self-pay | Admitting: Nurse Practitioner

## 2023-11-24 ENCOUNTER — Ambulatory Visit: Attending: Nurse Practitioner | Admitting: Nurse Practitioner

## 2023-11-24 VITALS — BP 156/93 | HR 81 | Ht 64.0 in | Wt 218.0 lb

## 2023-11-24 DIAGNOSIS — E785 Hyperlipidemia, unspecified: Secondary | ICD-10-CM

## 2023-11-24 DIAGNOSIS — I4811 Longstanding persistent atrial fibrillation: Secondary | ICD-10-CM | POA: Diagnosis not present

## 2023-11-24 DIAGNOSIS — I2510 Atherosclerotic heart disease of native coronary artery without angina pectoris: Secondary | ICD-10-CM

## 2023-11-24 DIAGNOSIS — I10 Essential (primary) hypertension: Secondary | ICD-10-CM | POA: Diagnosis not present

## 2023-11-24 DIAGNOSIS — G4733 Obstructive sleep apnea (adult) (pediatric): Secondary | ICD-10-CM

## 2023-11-24 DIAGNOSIS — I4891 Unspecified atrial fibrillation: Secondary | ICD-10-CM | POA: Diagnosis not present

## 2023-11-24 DIAGNOSIS — E782 Mixed hyperlipidemia: Secondary | ICD-10-CM

## 2023-11-24 MED ORDER — BLOOD PRESSURE MONITOR DEVI: 1.0000 | Freq: Every day | 0 refills | Status: AC

## 2023-11-24 MED ORDER — HYDRALAZINE HCL 50 MG PO TABS: 50.0000 mg | ORAL_TABLET | Freq: Three times a day (TID) | ORAL | 4 refills | Status: DC

## 2023-11-24 MED ORDER — METOPROLOL TARTRATE 100 MG PO TABS: 100.0000 mg | ORAL_TABLET | Freq: Every day | ORAL | 1 refills | Status: DC

## 2023-11-24 NOTE — Progress Notes (Signed)
 Cardiology Office Note:  .   Date: 11/24/2023 ID:  Andrea Grant, DOB 29-Jul-1961, MRN [id number] PCP: Orlena Bitters, MD  [facility] Providers Cardiologist:  Armida Lander, MD Sleep Medicine:  Gaylyn Keas, MD    History of Present Illness: .   Andrea Grant is a 63 y.o. female with a PMH of CAD, HLD, T2DM, HTN, A-fib, and OSA (does not tolerate CPAP), who presents today for overdue follow-up.   Previous cath performed in 2015 due to mild trop elevation in setting of severe HTN revealed nonobstructive CAD. Dx with A-fib in 2022. Underwent DCCV 05/2022, did not tolerate Amiodarone , was stopped.   Last seen by Gussie Legato, NP on September 22, 2022. BP elevated, pt reported taking Labetalol  in addition to Metoprolol . Labetalol  was stopped and Lopressor  was increased to 75 mg BID, Hydralazine  decreased to 50 mg BID.   Today she presents for follow-up. Tells me she did not stop Labetalol  and has returned to taking it in addition to her Lopressor  and Hydralazine . Says she returned to taking her second beta blocker after her BP started to become elevated and says she is tolerating medication therapy well and is better at controlling her BP per her report. Does admit to owning a wrist cuff. 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 Denies any chest pain, shortness of breath, palpitations, syncope, presyncope, dizziness, orthopnea, PND, swelling or significant weight changes, acute bleeding, or claudication.   ROS: Negative. See HPI.  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   Studies Reviewed: Aaron Aas    EKG:  EKG Interpretation Date/Time:  Friday November 24 2023 16:03:47 EDT Ventricular Rate:  83 PR Interval:    QRS Duration:  76 QT Interval:  360 QTC Calculation: 423 R Axis:   82  Text Interpretation: Atrial fibrillation Cannot rule out Anterior infarct , age undetermined When compared with ECG of  20-May-2022 12:53, Atrial fibrillation has replaced Sinus rhythm Confirmed by Lasalle Pointer [phone]) on 11/24/2023 4:25:24 PM   Echo 05/2022:   1. Left ventricular ejection fraction, by estimation, is 65 to 70%. The  left ventricle has normal function. The left ventricle has no regional  wall motion abnormalities. There is mild left ventricular hypertrophy.  Left ventricular diastolic parameters  are indeterminate.   2. Right ventricular systolic function is normal. The right ventricular  size is normal. Tricuspid regurgitation signal is inadequate for assessing  PA pressure.   3. Left atrial size was moderately dilated.   4. The mitral valve is abnormal. Mild mitral valve regurgitation. No  evidence of mitral stenosis.   5. The aortic valve is tricuspid. Aortic valve regurgitation is not  visualized. No aortic stenosis is present.   6. The inferior vena cava is normal in size with greater than 50%  respiratory variability, suggesting right atrial pressure of 3 mmHg.   Physical Exam:   VS:  BP (!) 156/93 (BP Location: Left Arm, Patient Position: Sitting, Cuff Size: Normal)   Pulse 81   Ht 5\' 4"  (1.626 m)   Wt 218 lb (98.9 kg)   LMP  (LMP Unknown)   SpO2 97%   BMI 37.42 kg/m    Wt Readings from Last 3 Encounters:  11/24/23 218 lb (98.9 kg)  09/22/22 220 lb (99.8 kg)  05/20/22 219 lb (99.3 kg)    GEN: Obese, 63 y.o. female in no acute distress NECK: No JVD; No carotid  bruits CARDIAC: S1/S2, irregularly irregular rhythm, no murmurs, rubs, gallops RESPIRATORY:  Clear to auscultation without rales, wheezing or rhonchi  ABDOMEN: Soft, non-tender, non-distended EXTREMITIES:  No edema; No deformity   ASSESSMENT AND PLAN: .    Non-obstructive CAD Previous cath performed in 2015 due to mild trop elevation in setting of severe HTN revealed nonobstructive CAD. Stable with no anginal symptoms. No indication for ischemic evaluation. Not on aspirin  due to being on Eliquis . Did discuss risk  of being on 2 beta blockers, pt does not want me to stop one and refuses for me to stop despite how I educated her about the risks. Pt is agreeable for me to reduce dosage of one. Will decrease Lopressor  to 100 mg daily and increase hydralazine  to 50 mg TID. Heart healthy diet and regular cardiovascular exercise encouraged. No other med changes. Care and ED precautions discussed. Will give her BP cuff and bring her back in 4-6 weeks for BP check.   HLD No recent labs on file. Will obtain FLP and CMET. Continue atorvastatin . Heart healthy diet and regular cardiovascular exercise encouraged. Continue to follow with PCP.  HTN BP is not at goal. Discussed to monitor BP at home at least 2 hours after medications and sitting for 5-10 minutes. Providing her with BP cuff. See medication changes as outlined above. Again, pt refused to stop one beta blocker out of two she is currently taking. I discussed risks of taking both simultaneously and she verbalized understanding and is aware of risks. Will obtain CMET and CBC. Heart healthy diet and regular cardiovascular exercise encouraged.   A-fib She is in rate controlled A-fib today on EKG. No palpations or tachycardia.  Continue Eliquis  for stroke prevention.  She is on 2 beta-blockers as mentioned above.  I discussed risk of simultaneously taking 2 beta-blockers at the same time.  She verbalized understanding is aware of the risks.  Medication changes as outlined above. Heart healthy diet and regular cardiovascular exercise encouraged.   OSA  Noncompliant with CPAP, also more than likely contributing to her elevated BP readings. Continue to follow with PCP.  At next visit, will revisit to see if she is open to repeating sleep study.  Dispo: Follow-up with me/[facility] in 3 to 4 months or sooner if any changes.  Signed, Lasalle Pointer, NP

## 2023-11-24 NOTE — Patient Instructions (Addendum)
 Medication Instructions:  Your physician has recommended you make the following change in your medication:  Please Decrease Lopressor  to 100 Mg Daily  Please Increase Hydralazine  to 50 Mg three times daily   Labwork: In 1-2 weeks at [facility]   Testing/Procedures: None   Follow-Up: Your physician recommends that you schedule a follow-up appointment in:  4-6 weeks Nurse Blood Pressure check 3-4 months    Any Other Special Instructions Will Be Listed Below (If Applicable).  If you need a refill on your cardiac medications before your next appointment, please call your pharmacy.

## 2023-12-27 ENCOUNTER — Ambulatory Visit: Attending: Internal Medicine

## 2024-02-21 ENCOUNTER — Other Ambulatory Visit: Payer: Self-pay | Admitting: Cardiology

## 2024-02-26 ENCOUNTER — Encounter: Payer: Self-pay | Admitting: Nurse Practitioner

## 2024-02-26 ENCOUNTER — Ambulatory Visit: Attending: Nurse Practitioner | Admitting: Nurse Practitioner

## 2024-02-26 ENCOUNTER — Telehealth: Payer: Self-pay | Admitting: Nurse Practitioner

## 2024-02-26 VITALS — BP 142/80 | HR 84 | Ht 64.0 in | Wt 218.0 lb

## 2024-02-26 DIAGNOSIS — E785 Hyperlipidemia, unspecified: Secondary | ICD-10-CM

## 2024-02-26 DIAGNOSIS — I2510 Atherosclerotic heart disease of native coronary artery without angina pectoris: Secondary | ICD-10-CM

## 2024-02-26 DIAGNOSIS — Z79899 Other long term (current) drug therapy: Secondary | ICD-10-CM | POA: Diagnosis not present

## 2024-02-26 DIAGNOSIS — I10 Essential (primary) hypertension: Secondary | ICD-10-CM

## 2024-02-26 DIAGNOSIS — I4891 Unspecified atrial fibrillation: Secondary | ICD-10-CM

## 2024-02-26 DIAGNOSIS — G4733 Obstructive sleep apnea (adult) (pediatric): Secondary | ICD-10-CM

## 2024-02-26 MED ORDER — HYDROCHLOROTHIAZIDE 12.5 MG PO CAPS: 12.5000 mg | ORAL_CAPSULE | Freq: Every day | ORAL | Status: DC

## 2024-02-26 NOTE — Telephone Encounter (Signed)
  Patient Consent for Virtual Visit        Andrea Grant has provided verbal consent on 02/26/2024 for a virtual visit (video or telephone).   CONSENT FOR VIRTUAL VISIT FOR:  Andrea Grant  By participating in this virtual visit I agree to the following:  I hereby voluntarily request, consent and authorize [facility] and its employed or contracted physicians, physician assistants, nurse practitioners or other licensed health care professionals (the Practitioner), to provide me with telemedicine health care services (the "Services) as deemed necessary by the treating Practitioner. I acknowledge and consent to receive the Services by the Practitioner via telemedicine. I understand that the telemedicine visit will involve communicating with the Practitioner through live audiovisual communication technology and the disclosure of certain medical information by electronic transmission. I acknowledge that I have been given the opportunity to request an in-person assessment or other available alternative prior to the telemedicine visit and am voluntarily participating in the telemedicine visit.  I understand that I have the right to withhold or withdraw my consent to the use of telemedicine in the course of my care at any time, without affecting my right to future care or treatment, and that the Practitioner or I may terminate the telemedicine visit at any time. I understand that I have the right to inspect all information obtained and/or recorded in the course of the telemedicine visit and may receive copies of available information for a reasonable fee.  I understand that some of the potential risks of receiving the Services via telemedicine include:  Delay or interruption in medical evaluation due to technological equipment failure or disruption; Information transmitted may not be sufficient (e.g. poor resolution of images) to allow for appropriate medical decision making by the Practitioner;  and/or  In rare instances, security protocols could fail, causing a breach of personal health information.  Furthermore, I acknowledge that it is my responsibility to provide information about my medical history, conditions and care that is complete and accurate to the best of my ability. I acknowledge that Practitioner's advice, recommendations, and/or decision may be based on factors not within their control, such as incomplete or inaccurate data provided by me or distortions of diagnostic images or specimens that may result from electronic transmissions. I understand that the practice of medicine is not an exact science and that Practitioner makes no warranties or guarantees regarding treatment outcomes. I acknowledge that a copy of this consent can be made available to me via my patient portal [facility] MyChart), or I can request a printed copy by calling the office of [facility].    I understand that my insurance will be billed for this visit.   I have read or had this consent read to me. I understand the contents of this consent, which adequately explains the benefits and risks of the Services being provided via telemedicine.  I have been provided ample opportunity to ask questions regarding this consent and the Services and have had my questions answered to my satisfaction. I give my informed consent for the services to be provided through the use of telemedicine in my medical care

## 2024-02-26 NOTE — Patient Instructions (Addendum)
 Medication Instructions:  Your physician has recommended you make the following change in your medication:  Please start Hydrochlorothiazide  12.5 Mg daily   Labwork: In 2 weeks with [facility]   Testing/Procedures: None   Follow-Up: Your physician recommends that you schedule a follow-up appointment in: 2-3 Months via Telephone   Any Other Special Instructions Will Be Listed Below (If Applicable).  If you need a refill on your cardiac medications before your next appointment, please call your pharmacy.   (Patient Name)-_____________________________  (MRN)-_________________________     (Physician)-_________________________________   (DATE) -_________ (Blood Pressure)-_________  (Heart Rate)-_________    (DATE) -_________ (Blood Pressure)-_________  (Heart Rate)-_________    (DATE) -_________ (Blood Pressure)-_________  (Heart Rate)-_________   (DATE) -_________ (Blood Pressure)-_________  (Heart Rate)-_________    (DATE) -_________ (Blood Pressure)-_________  (Heart Rate)-_________    (DATE) -_________ (Blood Pressure)-_________  (Heart Rate)-_________   (DATE) -_________ (Blood Pressure)-_________  (Heart Rate)-_________    (DATE) -_________ (Blood Pressure)-_________  (Heart Rate)-_________    (DATE) -_________ (Blood Pressure)-_________  (Heart Rate)-_________    (DATE) -_________ (Blood Pressure)-_________  (Heart Rate)-_________    (DATE) -_________ (Blood Pressure)-_________  (Heart Rate)-_________    (DATE) -_________ (Blood Pressure)-_________  (Heart Rate)-_________    (DATE) -_________ (Blood Pressure)-_________  (Heart Rate)-_________    (DATE) -_________ (Blood Pressure)-_________  (Heart Rate)-_________

## 2024-02-26 NOTE — Progress Notes (Unsigned)
 Cardiology Office Note:  .   Date: 02/26/2024 ID:  Andrea Grant, DOB 1961/01/14, MRN [id number] PCP: Andrea Leta NOVAK, MD  [facility] Providers Cardiologist:  [name], MD Sleep Medicine:  [name], MD    History of Present Illness: .   Andrea Grant is a 63 y.o. female with a PMH of CAD, HLD, T2DM, HTN, A-fib, and OSA (does not tolerate CPAP), who presents today for 3-4 month follow-up.   Previous cath performed in 2015 due to mild trop elevation in setting of severe HTN revealed nonobstructive CAD. Dx with A-fib in 2022. Underwent DCCV 05/2022, did not tolerate Amiodarone , was stopped.   Last seen by [name], NP on September 22, 2022. BP elevated, pt reported taking Labetalol  in addition to Metoprolol . Labetalol  was stopped and Lopressor  was increased to 75 mg BID, Hydralazine  decreased to 50 mg BID.   11/24/2023 - Today she presents for follow-up. Tells me she did not stop Labetalol  and has returned to taking it in addition to her Lopressor  and Hydralazine . Says she returned to taking her second beta blocker after her BP started to become elevated and says she is tolerating medication therapy well and is better at controlling her BP per her report. Does admit to owning a wrist cuff. 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 Denies any chest pain, shortness of breath, palpitations, syncope, presyncope, dizziness, orthopnea, PND, swelling or significant weight changes, acute bleeding, or claudication.  02/26/2024 -  Here for follow-up. Doing well. Has stopped Metoprolol  and only taking Labetalol  as her beta blocker. Tells me her BP has been well controlled at home. Denies any chest pain, shortness of breath, palpitations, syncope, presyncope, dizziness, orthopnea, PND, swelling or significant weight changes, acute bleeding, or claudication.   ROS: Negative. See HPI.  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 Reports about a new grandbaby born recently.   Studies Reviewed: [name]    EKG: EKG is not ordered today.      Echo 05/2022:   1. Left ventricular ejection fraction, by estimation, is 65 to 70%. The  left ventricle has normal function. The left ventricle has no regional  wall motion abnormalities. There is mild left ventricular hypertrophy.  Left ventricular diastolic parameters  are indeterminate.   2. Right ventricular systolic function is normal. The right ventricular  size is normal. Tricuspid regurgitation signal is inadequate for assessing  PA pressure.   3. Left atrial size was moderately dilated.   4. The mitral valve is abnormal. Mild mitral valve regurgitation. No  evidence of mitral stenosis.   5. The aortic valve is tricuspid. Aortic valve regurgitation is not  visualized. No aortic stenosis is present.   6. The inferior vena cava is normal in size with greater than 50%  respiratory variability, suggesting right atrial pressure of 3 mmHg.   Physical Exam:   VS:  BP (!) 142/80   Pulse 84   Ht 5' 4 (1.626 m)   Wt 218 lb (98.9 kg)   LMP  (LMP Unknown)   SpO2 97%   BMI 37.42 kg/m    Wt Readings from Last 3 Encounters:  02/26/24 218 lb (98.9 kg)  11/24/23 218 lb (98.9 kg)  09/22/22 220 lb (99.8 kg)    GEN: Obese, 63 y.o. female in no acute distress NECK: No JVD; No carotid bruits CARDIAC: S1/S2, irregularly irregular rhythm, no murmurs, rubs, gallops RESPIRATORY:  Clear to auscultation without rales, wheezing or rhonchi  ABDOMEN: Soft, non-tender, non-distended EXTREMITIES:  No edema; No deformity   ASSESSMENT AND PLAN: .    Non-obstructive CAD Previous cath performed in 2015 due to mild trop elevation in setting of severe HTN revealed nonobstructive CAD. Stable with no anginal symptoms. No indication for ischemic evaluation. Not on aspirin  due to being on Eliquis . No medication changes at this time. Heart healthy diet and regular cardiovascular  exercise encouraged. Care and ED precautions discussed.   HLD Most recent LDL 97. Continue atorvastatin . Heart healthy diet and regular cardiovascular exercise encouraged. Continue to follow with PCP.  HTN, medication management BP is not at goal. Discussed to monitor BP at home at least 2 hours after medications and sitting for 5-10 minutes. Providing her with BP cuff. No longer on two different beta blockers. Will begin hydrochlorothiazide  12.5 mg daily and obtain BMET in 2 weeks.  No other medication changes at this time.  Heart healthy diet and regular cardiovascular exercise encouraged.   A-fib No palpations or tachycardia.  Heart rate is well-controlled.  Continue labetalol .  Continue Eliquis  for stroke prevention.  Will remove metoprolol  off her med list.  No other medication changes at this time.  Heart healthy diet and regular cardiovascular exercise encouraged.   OSA  Noncompliant with CPAP, also more than likely contributing to her elevated BP readings. Continue to follow with PCP.  At next visit, will revisit to see if she is open to repeating sleep study.  Dispo: Follow-up with me/APP in 2-3 months or sooner if any changes.  Signed, [name], NP

## 2024-02-27 ENCOUNTER — Encounter: Payer: Self-pay | Admitting: Nurse Practitioner

## 2024-03-26 ENCOUNTER — Other Ambulatory Visit: Payer: Self-pay | Admitting: Internal Medicine

## 2024-03-26 DIAGNOSIS — Z1231 Encounter for screening mammogram for malignant neoplasm of breast: Secondary | ICD-10-CM

## 2024-04-02 ENCOUNTER — Ambulatory Visit
Admission: RE | Admit: 2024-04-02 | Discharge: 2024-04-02 | Disposition: A | Discharge status: Home / Self Care | Source: Ambulatory Visit | Attending: Internal Medicine | Admitting: Internal Medicine

## 2024-04-02 DIAGNOSIS — Z1231 Encounter for screening mammogram for malignant neoplasm of breast: Secondary | ICD-10-CM

## 2024-04-08 ENCOUNTER — Other Ambulatory Visit: Payer: Self-pay | Admitting: Internal Medicine

## 2024-04-08 DIAGNOSIS — R928 Other abnormal and inconclusive findings on diagnostic imaging of breast: Secondary | ICD-10-CM

## 2024-04-15 ENCOUNTER — Ambulatory Visit
Admission: RE | Admit: 2024-04-15 | Discharge: 2024-04-15 | Disposition: A | Discharge status: Home / Self Care | Source: Ambulatory Visit | Attending: Internal Medicine | Admitting: Internal Medicine

## 2024-04-15 DIAGNOSIS — R928 Other abnormal and inconclusive findings on diagnostic imaging of breast: Secondary | ICD-10-CM

## 2024-04-16 ENCOUNTER — Other Ambulatory Visit: Payer: Self-pay | Admitting: Internal Medicine

## 2024-04-16 DIAGNOSIS — N6489 Other specified disorders of breast: Secondary | ICD-10-CM

## 2024-04-17 ENCOUNTER — Inpatient Hospital Stay
Admission: RE | Admit: 2024-04-17 | Discharge: 2024-04-17 | Discharge status: Home / Self Care | Source: Ambulatory Visit | Attending: Internal Medicine | Admitting: Internal Medicine

## 2024-04-17 ENCOUNTER — Ambulatory Visit
Admission: RE | Admit: 2024-04-17 | Discharge: 2024-04-17 | Disposition: A | Discharge status: Home / Self Care | Source: Ambulatory Visit | Attending: Internal Medicine | Admitting: Internal Medicine

## 2024-04-17 DIAGNOSIS — N6489 Other specified disorders of breast: Secondary | ICD-10-CM

## 2024-04-17 HISTORY — PX: BREAST BIOPSY: SHX20

## 2024-04-18 LAB — SURGICAL PATHOLOGY

## 2024-05-08 ENCOUNTER — Other Ambulatory Visit: Payer: Self-pay | Admitting: Cardiology

## 2024-05-08 NOTE — Telephone Encounter (Signed)
 Prescription refill request for Eliquis  received. Indication: AF Last office visit: 02/26/24  FORBES Crate NP Scr: 1.09 on 02/08/24  [facility] Age: 63 Weight: 98.9kg  Based on above findings Eliquis  5mg  twice daily is the appropriate dose.  Refill approved.

## 2024-05-09 ENCOUNTER — Telehealth: Admitting: Nurse Practitioner

## 2024-05-28 ENCOUNTER — Encounter: Payer: Self-pay | Admitting: Nurse Practitioner

## 2024-06-18 ENCOUNTER — Other Ambulatory Visit: Payer: Self-pay | Admitting: Nurse Practitioner

## 2024-06-21 ENCOUNTER — Telehealth: Admitting: Nurse Practitioner

## 2024-06-26 ENCOUNTER — Other Ambulatory Visit: Payer: Self-pay | Admitting: Nurse Practitioner

## 2024-07-01 ENCOUNTER — Ambulatory Visit: Admitting: Nurse Practitioner

## 2024-07-01 NOTE — Progress Notes (Deleted)
 Cardiology Office Note:  .   Date: 02/26/2024 ID:  Andrea Grant, DOB 09-13-1960, MRN [id number] PCP: Andrea Leta NOVAK, MD  [facility] Providers Cardiologist:  Andrea Carrier, MD Sleep Medicine:  Andrea Bihari, MD    History of Present Illness: .   Andrea Grant is a 63 y.o. female with a PMH of CAD, HLD, T2DM, HTN, A-fib, and OSA (does not tolerate CPAP), who presents today for 3-4 month follow-up.   Previous cath performed in 2015 due to mild trop elevation in setting of severe HTN revealed nonobstructive CAD. Dx with A-fib in 2022. Underwent DCCV 05/2022, did not tolerate Amiodarone , was stopped.   Last seen by Andrea Lobstein, NP on September 22, 2022. BP elevated, pt reported taking Labetalol  in addition to Metoprolol . Labetalol  was stopped and Lopressor  was increased to 75 mg BID, Hydralazine  decreased to 50 mg BID.   11/24/2023 - Today she presents for follow-up. Tells me she did not stop Labetalol  and has returned to taking it in addition to her Lopressor  and Hydralazine . Says she returned to taking her second beta blocker after her BP started to become elevated and says she is tolerating medication therapy well and is better at controlling her BP per her report. Does admit to owning a wrist cuff. 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 Denies any chest pain, shortness of breath, palpitations, syncope, presyncope, dizziness, orthopnea, PND, swelling or significant weight changes, acute bleeding, or claudication.  02/26/2024 -  Here for follow-up. Doing well. Has stopped Metoprolol  and only taking Labetalol  as her beta blocker. Tells me her BP has been well controlled at home. Denies any chest pain, shortness of breath, palpitations, syncope, presyncope, dizziness, orthopnea, PND, swelling or significant weight changes, acute bleeding, or claudication.   ROS: Negative. See HPI.  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 Reports about a new grandbaby born recently.   Studies Reviewed: Andrea Grant    EKG: EKG is not ordered today.      Echo 05/2022:   1. Left ventricular ejection fraction, by estimation, is 65 to 70%. The  left ventricle has normal function. The left ventricle has no regional  wall motion abnormalities. There is mild left ventricular hypertrophy.  Left ventricular diastolic parameters  are indeterminate.   2. Right ventricular systolic function is normal. The right ventricular  size is normal. Tricuspid regurgitation signal is inadequate for assessing  PA pressure.   3. Left atrial size was moderately dilated.   4. The mitral valve is abnormal. Mild mitral valve regurgitation. No  evidence of mitral stenosis.   5. The aortic valve is tricuspid. Aortic valve regurgitation is not  visualized. No aortic stenosis is present.   6. The inferior vena cava is normal in size with greater than 50%  respiratory variability, suggesting right atrial pressure of 3 mmHg.   Physical Exam:   VS:  LMP  (LMP Unknown)    Wt Readings from Last 3 Encounters:  02/26/24 218 lb (98.9 kg)  11/24/23 218 lb (98.9 kg)  09/22/22 220 lb (99.8 kg)    GEN: Obese, 63 y.o. female in no acute distress NECK: No JVD; No carotid bruits CARDIAC: S1/S2, irregularly irregular rhythm, no murmurs, rubs, gallops RESPIRATORY:  Clear to auscultation without rales, wheezing or rhonchi  ABDOMEN: Soft, non-tender, non-distended EXTREMITIES:  No edema; No deformity   ASSESSMENT AND PLAN: .    Non-obstructive CAD Previous  cath performed in 2015 due to mild trop elevation in setting of severe HTN revealed nonobstructive CAD. Stable with no anginal symptoms. No indication for ischemic evaluation. Not on aspirin  due to being on Eliquis . No medication changes at this time. Heart healthy diet and regular cardiovascular exercise encouraged. Care and ED precautions discussed.   HLD Most recent LDL 97. Continue atorvastatin .  Heart healthy diet and regular cardiovascular exercise encouraged. Continue to follow with PCP.  HTN, medication management BP is not at goal. Discussed to monitor BP at home at least 2 hours after medications and sitting for 5-10 minutes. Providing her with BP cuff. No longer on two different beta blockers. Will begin hydrochlorothiazide  12.5 mg daily and obtain BMET in 2 weeks.  No other medication changes at this time.  Heart healthy diet and regular cardiovascular exercise encouraged.   A-fib No palpations or tachycardia.  Heart rate is well-controlled.  Continue labetalol .  Continue Eliquis  for stroke prevention.  Will remove metoprolol  off her med list.  No other medication changes at this time.  Heart healthy diet and regular cardiovascular exercise encouraged.   OSA  Noncompliant with CPAP, also more than likely contributing to her elevated BP readings. Continue to follow with PCP.  At next visit, will revisit to see if she is open to repeating sleep study.  Dispo: Follow-up with me/APP in 2-3 months or sooner if any changes.  Signed, Andrea Crate, NP

## 2024-07-02 ENCOUNTER — Ambulatory Visit: Attending: Nurse Practitioner | Admitting: Nurse Practitioner

## 2024-07-02 ENCOUNTER — Encounter: Payer: Self-pay | Admitting: Nurse Practitioner

## 2024-07-02 VITALS — BP 128/86 | HR 71 | Ht 64.0 in | Wt 219.6 lb

## 2024-07-02 DIAGNOSIS — I10 Essential (primary) hypertension: Secondary | ICD-10-CM

## 2024-07-02 DIAGNOSIS — E785 Hyperlipidemia, unspecified: Secondary | ICD-10-CM | POA: Diagnosis not present

## 2024-07-02 DIAGNOSIS — G4733 Obstructive sleep apnea (adult) (pediatric): Secondary | ICD-10-CM

## 2024-07-02 DIAGNOSIS — I4891 Unspecified atrial fibrillation: Secondary | ICD-10-CM | POA: Diagnosis not present

## 2024-07-02 DIAGNOSIS — I2510 Atherosclerotic heart disease of native coronary artery without angina pectoris: Secondary | ICD-10-CM | POA: Diagnosis not present

## 2024-07-02 DIAGNOSIS — E669 Obesity, unspecified: Secondary | ICD-10-CM

## 2024-07-02 NOTE — Patient Instructions (Signed)
 Medication Instructions:  Your physician recommends that you continue on your current medications as directed. Please refer to the Current Medication list given to you today.   Labwork: Fasting Lipid Panel in one week at [facility]  Testing/Procedures: None  Follow-Up: Your physician recommends that you schedule a follow-up appointment in: 6 months  Any Other Special Instructions Will Be Listed Below (If Applicable). Thank you for choosing [facility]!     If you need a refill on your cardiac medications before your next appointment, please call your pharmacy.

## 2024-07-02 NOTE — Progress Notes (Unsigned)
 Cardiology Office Note:  .   Date: 02/26/2024 ID:  Andrea Grant, DOB 09-13-1960, MRN [id number] PCP: [name], MD  [facility] Providers Cardiologist:  [name], MD Sleep Medicine:  [name], MD    History of Present Illness: .   Andrea Grant is a 63 y.o. female with a PMH of CAD, HLD, T2DM, HTN, A-fib, and OSA (does not tolerate CPAP), who presents today for 3-4 month follow-up.   Previous cath performed in 2015 due to mild trop elevation in setting of severe HTN revealed nonobstructive CAD. Dx with A-fib in 2022. Underwent DCCV 05/2022, did not tolerate Amiodarone , was stopped.   Last seen by [name], NP on September 22, 2022. BP elevated, pt reported taking Labetalol  in addition to Metoprolol . Labetalol  was stopped and Lopressor  was increased to 75 mg BID, Hydralazine  decreased to 50 mg BID.   11/24/2023 - Today she presents for follow-up. Tells me she did not stop Labetalol  and has returned to taking it in addition to her Lopressor  and Hydralazine . Says she returned to taking her second beta blocker after her BP started to become elevated and says she is tolerating medication therapy well and is better at controlling her BP per her report. Does admit to owning a wrist cuff. 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 Denies any chest pain, shortness of breath, palpitations, syncope, presyncope, dizziness, orthopnea, PND, swelling or significant weight changes, acute bleeding, or claudication.  02/26/2024 -  Here for follow-up. Doing well. Has stopped Metoprolol  and only taking Labetalol  as her beta blocker. Tells me her BP has been well controlled at home. Denies any chest pain, shortness of breath, palpitations, syncope, presyncope, dizziness, orthopnea, PND, swelling or significant weight changes, acute bleeding, or claudication.   ROS: Negative. See HPI.  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 Reports about a new grandbaby born recently.   Studies Reviewed: [name]    EKG: EKG is not ordered today.      Echo 05/2022:   1. Left ventricular ejection fraction, by estimation, is 65 to 70%. The  left ventricle has normal function. The left ventricle has no regional  wall motion abnormalities. There is mild left ventricular hypertrophy.  Left ventricular diastolic parameters  are indeterminate.   2. Right ventricular systolic function is normal. The right ventricular  size is normal. Tricuspid regurgitation signal is inadequate for assessing  PA pressure.   3. Left atrial size was moderately dilated.   4. The mitral valve is abnormal. Mild mitral valve regurgitation. No  evidence of mitral stenosis.   5. The aortic valve is tricuspid. Aortic valve regurgitation is not  visualized. No aortic stenosis is present.   6. The inferior vena cava is normal in size with greater than 50%  respiratory variability, suggesting right atrial pressure of 3 mmHg.   Physical Exam:   VS:  LMP  (LMP Unknown)    Wt Readings from Last 3 Encounters:  02/26/24 218 lb (98.9 kg)  11/24/23 218 lb (98.9 kg)  09/22/22 220 lb (99.8 kg)    GEN: Obese, 63 y.o. female in no acute distress NECK: No JVD; No carotid bruits CARDIAC: S1/S2, irregularly irregular rhythm, no murmurs, rubs, gallops RESPIRATORY:  Clear to auscultation without rales, wheezing or rhonchi  ABDOMEN: Soft, non-tender, non-distended EXTREMITIES:  No edema; No deformity   ASSESSMENT AND PLAN: .    Non-obstructive CAD Previous  cath performed in 2015 due to mild trop elevation in setting of severe HTN revealed nonobstructive CAD. Stable with no anginal symptoms. No indication for ischemic evaluation. Not on aspirin  due to being on Eliquis . No medication changes at this time. Heart healthy diet and regular cardiovascular exercise encouraged. Care and ED precautions discussed.   HLD Most recent LDL 97. Continue atorvastatin .  Heart healthy diet and regular cardiovascular exercise encouraged. Continue to follow with PCP.  HTN, medication management BP is not at goal. Discussed to monitor BP at home at least 2 hours after medications and sitting for 5-10 minutes. Providing her with BP cuff. No longer on two different beta blockers. Will begin hydrochlorothiazide  12.5 mg daily and obtain BMET in 2 weeks.  No other medication changes at this time.  Heart healthy diet and regular cardiovascular exercise encouraged.   A-fib No palpations or tachycardia.  Heart rate is well-controlled.  Continue labetalol .  Continue Eliquis  for stroke prevention.  Will remove metoprolol  off her med list.  No other medication changes at this time.  Heart healthy diet and regular cardiovascular exercise encouraged.   OSA  Noncompliant with CPAP, also more than likely contributing to her elevated BP readings. Continue to follow with PCP.  At next visit, will revisit to see if she is open to repeating sleep study.  Dispo: Follow-up with me/APP in 2-3 months or sooner if any changes.  Signed, [name], NP

## 2024-08-07 ENCOUNTER — Telehealth: Payer: Self-pay | Admitting: Cardiology

## 2024-08-07 NOTE — Telephone Encounter (Signed)
"  ° °  Pre-operative Risk Assessment    Patient Name: Andrea Grant  DOB: 04/04/1961 MRN: [id number]   Date of last office visit: 07/02/24 Date of next office visit: Not yet scheduled   Request for Surgical Clearance    Procedure:  Carpal Tunnel and Trigger Finger Release  Date of Surgery:  Clearance 08/20/24                                Surgeon:  Dr. Delene Surgeon's Group or Practice Name:  [facility]  Phone number:  [phone]  Fax number:  [phone]   Type of Clearance Requested:   - Pharmacy:  Hold Eliquis       Type of Anesthesia:  General    Additional requests/questions:  Caller Francie) stated they will need a 3 day hold of patient's Eliquis .    Signed, Jasmin B Wilson   08/07/2024, 4:13 PM   "

## 2024-08-07 NOTE — Telephone Encounter (Signed)
 Patient with diagnosis of afib on Eliquis  for anticoagulation.    Procedure: Carpal Tunnel and Trigger Finger Release  Date of procedure: 08/20/24   CHA2DS2-VASc Score = 4   This indicates a 4.8% annual risk of stroke. The patient's score is based upon: CHF History: 0 HTN History: 1 Diabetes History: 1 Stroke History: 0 Vascular Disease History: 1 Age Score: 0 Gender Score: 1      CrCl 67 ml/min Platelet count 277  Patient has not had an Afib/aflutter ablation in the last 3 months, DCCV within the last 4 weeks or a watchman implanted in the last 45 days   Per office protocol, patient can hold Eliquis  for 2 days prior to procedure.    **This guidance is not considered finalized until pre-operative APP has relayed final recommendations.**

## 2024-08-08 NOTE — Telephone Encounter (Signed)
"  ° °  Patient Name: Andrea Grant  DOB: 1960-09-07 MRN: [id number]  Primary Cardiologist: [name], MD  Clinical pharmacists have reviewed the patient's past medical history, labs, and current medications as part of preoperative protocol coverage. The following recommendations have been made:   Patient with diagnosis of afib on Eliquis  for anticoagulation.     Procedure: Carpal Tunnel and Trigger Finger Release  Date of procedure: 08/20/24     CHA2DS2-VASc Score = 4   This indicates a 4.8% annual risk of stroke. The patient's score is based upon: CHF History: 0 HTN History: 1 Diabetes History: 1 Stroke History: 0 Vascular Disease History: 1 Age Score: 0 Gender Score: 1       CrCl 67 ml/min Platelet count 277   Patient has not had an Afib/aflutter ablation in the last 3 months, DCCV within the last 4 weeks or a watchman implanted in the last 45 days    Per office protocol, patient can hold Eliquis  for 2 days prior to procedure.  Please resume Eliquis  as soon as possible postprocedure, at the discretion of the surgeon.    I will route this recommendation to the requesting party via Epic fax function and remove from pre-op pool.  Please call with questions.  Damien JAYSON Braver, NP 08/08/2024, 11:37 AM  "

## 2024-08-21 ENCOUNTER — Other Ambulatory Visit: Payer: Self-pay | Admitting: Nurse Practitioner
